# Patient Record
Sex: Female | Born: 1965 | Race: White | Hispanic: No | Marital: Married | Smoking: Never smoker
Health system: Southern US, Community
[De-identification: ages and names within clinical notes are randomized; demographics above are authoritative.]

## PROBLEM LIST (undated history)

## (undated) DIAGNOSIS — K859 Acute pancreatitis without necrosis or infection, unspecified: Secondary | ICD-10-CM

## (undated) DIAGNOSIS — K219 Gastro-esophageal reflux disease without esophagitis: Secondary | ICD-10-CM

## (undated) DIAGNOSIS — IMO0002 Reserved for concepts with insufficient information to code with codable children: Secondary | ICD-10-CM

## (undated) DIAGNOSIS — J40 Bronchitis, not specified as acute or chronic: Secondary | ICD-10-CM

## (undated) DIAGNOSIS — Z803 Family history of malignant neoplasm of breast: Secondary | ICD-10-CM

## (undated) DIAGNOSIS — G4730 Sleep apnea, unspecified: Secondary | ICD-10-CM

## (undated) DIAGNOSIS — E785 Hyperlipidemia, unspecified: Secondary | ICD-10-CM

## (undated) HISTORY — PX: BUNIONECTOMY: SHX129

## (undated) HISTORY — DX: Sleep apnea, unspecified: G47.30

## (undated) HISTORY — DX: Gastro-esophageal reflux disease without esophagitis: K21.9

## (undated) HISTORY — DX: Acute pancreatitis without necrosis or infection, unspecified: K85.90

## (undated) HISTORY — DX: Hyperlipidemia, unspecified: E78.5

## (undated) HISTORY — DX: Reserved for concepts with insufficient information to code with codable children: IMO0002

## (undated) HISTORY — DX: Family history of malignant neoplasm of breast: Z80.3

## (undated) HISTORY — DX: Bronchitis, not specified as acute or chronic: J40

---
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
## 1999-08-22 ENCOUNTER — Other Ambulatory Visit: Admission: RE | Admit: 1999-08-22 | Discharge: 1999-08-22 | Payer: Self-pay | Admitting: Gynecology

## 2001-11-06 ENCOUNTER — Ambulatory Visit (HOSPITAL_COMMUNITY): Admission: RE | Admit: 2001-11-06 | Discharge: 2001-11-06 | Payer: Self-pay | Admitting: Obstetrics and Gynecology

## 2002-01-29 ENCOUNTER — Ambulatory Visit (HOSPITAL_COMMUNITY): Admission: RE | Admit: 2002-01-29 | Discharge: 2002-01-29 | Payer: Self-pay | Admitting: Obstetrics and Gynecology

## 2002-02-25 ENCOUNTER — Encounter: Admission: RE | Admit: 2002-02-25 | Discharge: 2002-02-25 | Payer: Self-pay | Admitting: Obstetrics and Gynecology

## 2002-04-09 ENCOUNTER — Inpatient Hospital Stay (HOSPITAL_COMMUNITY): Admission: RE | Admit: 2002-04-09 | Discharge: 2002-04-12 | Payer: Self-pay | Admitting: Obstetrics and Gynecology

## 2002-04-16 ENCOUNTER — Encounter: Admission: RE | Admit: 2002-04-16 | Discharge: 2002-05-16 | Payer: Self-pay | Admitting: Obstetrics and Gynecology

## 2002-05-26 ENCOUNTER — Other Ambulatory Visit: Admission: RE | Admit: 2002-05-26 | Discharge: 2002-05-26 | Payer: Self-pay | Admitting: Obstetrics and Gynecology

## 2002-09-25 ENCOUNTER — Encounter: Admission: RE | Admit: 2002-09-25 | Discharge: 2002-09-25 | Payer: Self-pay

## 2003-05-22 ENCOUNTER — Other Ambulatory Visit: Admission: RE | Admit: 2003-05-22 | Discharge: 2003-05-22 | Payer: Self-pay | Admitting: Obstetrics and Gynecology

## 2003-08-21 ENCOUNTER — Encounter: Admission: RE | Admit: 2003-08-21 | Discharge: 2003-11-19 | Payer: Self-pay | Admitting: Cardiology

## 2004-06-10 ENCOUNTER — Other Ambulatory Visit: Admission: RE | Admit: 2004-06-10 | Discharge: 2004-06-10 | Payer: Self-pay | Admitting: Obstetrics and Gynecology

## 2005-06-20 ENCOUNTER — Other Ambulatory Visit: Admission: RE | Admit: 2005-06-20 | Discharge: 2005-06-20 | Payer: Self-pay | Admitting: Obstetrics and Gynecology

## 2005-10-11 ENCOUNTER — Encounter: Admission: RE | Admit: 2005-10-11 | Discharge: 2005-10-11 | Payer: Self-pay | Admitting: Physician Assistant

## 2008-11-05 ENCOUNTER — Inpatient Hospital Stay (HOSPITAL_COMMUNITY): Admission: EM | Admit: 2008-11-05 | Discharge: 2008-11-13 | Payer: Self-pay | Admitting: Emergency Medicine

## 2009-12-06 ENCOUNTER — Emergency Department (HOSPITAL_COMMUNITY): Admission: EM | Admit: 2009-12-06 | Discharge: 2009-12-06 | Payer: Self-pay | Admitting: Emergency Medicine

## 2010-03-29 ENCOUNTER — Inpatient Hospital Stay (HOSPITAL_COMMUNITY): Admission: EM | Admit: 2010-03-29 | Discharge: 2010-04-02 | Payer: Self-pay | Admitting: Internal Medicine

## 2010-08-17 ENCOUNTER — Encounter (INDEPENDENT_AMBULATORY_CARE_PROVIDER_SITE_OTHER): Payer: Self-pay | Admitting: *Deleted

## 2010-08-24 NOTE — Letter (Signed)
Summary: New Patient letter  [facility]  [address] [place], [place] [address]   Phone: [phone]  Fax: [phone]       08/17/2010 MRN: [id number]  [name] [address] [place], [place]  [address]  Dear Ms. Shepler,  Welcome to the Gastroenterology Division at [facility].    You are scheduled to see Dr.  Arlyce Dice on 09-30-10 at 9:45A.M. on the 3rd floor at [facility], [address]  We ask that you try to arrive at our office 15 minutes prior to your appointment time to allow for check-in.  We would like you to complete the enclosed self-administered evaluation form prior to your visit and bring it with you on the day of your appointment.  We will review it with you.  Also, please bring a complete list of all your medications or, if you prefer, bring the medication bottles and we will list them.  Please bring your insurance card so that we may make a copy of it.  If your insurance requires a referral to see a specialist, please bring your referral form from your primary care physician.  Co-payments are due at the time of your visit and may be paid by cash, check or credit card.     Your office visit will consist of a consult with your physician (includes a physical exam), any laboratory testing he/she may order, scheduling of any necessary diagnostic testing (e.g. x-ray, ultrasound, CT-scan), and scheduling of a procedure (e.g. Endoscopy, Colonoscopy) if required.  Please allow enough time on your schedule to allow for any/all of these possibilities.    If you cannot keep your appointment, please call [phone] to cancel or reschedule prior to your appointment date.  This allows [place] the opportunity to schedule an appointment for another patient in need of care.  If you do not cancel or reschedule by 5 p.m. the business day prior to your appointment date, you will be charged a $50.00 late cancellation/no-show fee.    Thank you for choosing  [facility] for your medical needs.  We appreciate the opportunity to care for you.  Please visit [place] at our website  to learn more about our practice.                     Sincerely,                                                             The Gastroenterology Division

## 2010-09-15 LAB — COMPREHENSIVE METABOLIC PANEL
ALT: 12 U/L (ref 0–35)
ALT: 35 U/L (ref 0–35)
ALT: 37 U/L — ABNORMAL HIGH (ref 0–35)
AST: 36 U/L (ref 0–37)
AST: 46 U/L — ABNORMAL HIGH (ref 0–37)
Albumin: 3.1 g/dL — ABNORMAL LOW (ref 3.5–5.2)
Alkaline Phosphatase: 86 U/L (ref 39–117)
Alkaline Phosphatase: 70 U/L (ref 39–117)
Alkaline Phosphatase: 71 U/L (ref 39–117)
Alkaline Phosphatase: 80 U/L (ref 39–117)
BUN: 6 mg/dL (ref 6–23)
BUN: 6 mg/dL (ref 6–23)
BUN: 7 mg/dL (ref 6–23)
CO2: 23 mEq/L (ref 19–32)
CO2: 23 mEq/L (ref 19–32)
CO2: 24 mEq/L (ref 19–32)
CO2: 25 mEq/L (ref 19–32)
Calcium: 8.7 mg/dL (ref 8.4–10.5)
Calcium: 8.4 mg/dL (ref 8.4–10.5)
Calcium: 8.7 mg/dL (ref 8.4–10.5)
Chloride: 102 mEq/L (ref 96–112)
Chloride: 104 mEq/L (ref 96–112)
Creatinine, Ser: 0.68 mg/dL (ref 0.4–1.2)
GFR calc Af Amer: >60 mL/min (ref >60)
GFR calc non Af Amer: >60 mL/min (ref >60)
GFR calc non Af Amer: >60 mL/min (ref >60)
GFR calc non Af Amer: >60 mL/min (ref >60)
Glucose, Bld: 140 mg/dL — ABNORMAL HIGH (ref 70–99)
Glucose, Bld: 104 mg/dL — ABNORMAL HIGH (ref 70–99)
Glucose, Bld: 119 mg/dL — ABNORMAL HIGH (ref 70–99)
Potassium: 3.6 mEq/L (ref 3.5–5.1)
Potassium: 3.7 mEq/L (ref 3.5–5.1)
Potassium: 3.8 mEq/L (ref 3.5–5.1)
Sodium: 133 mEq/L — ABNORMAL LOW (ref 135–145)
Sodium: 134 mEq/L — ABNORMAL LOW (ref 135–145)
Sodium: 137 mEq/L (ref 135–145)
Total Bilirubin: 0.7 mg/dL (ref 0.3–1.2)
Total Bilirubin: 0.8 mg/dL (ref 0.3–1.2)
Total Bilirubin: 0.8 mg/dL (ref 0.3–1.2)
Total Protein: 6.5 g/dL (ref 6.0–8.3)
Total Protein: 5.9 g/dL — ABNORMAL LOW (ref 6.0–8.3)
Total Protein: 6.2 g/dL (ref 6.0–8.3)

## 2010-09-15 LAB — CBC
HCT: 34.6 % — ABNORMAL LOW (ref 36.0–46.0)
HCT: 34 % — ABNORMAL LOW (ref 36.0–46.0)
HCT: 34.5 % — ABNORMAL LOW (ref 36.0–46.0)
HCT: 36.8 % (ref 36.0–46.0)
Hemoglobin: 10.8 g/dL — ABNORMAL LOW (ref 12.0–15.0)
Hemoglobin: 11.1 g/dL — ABNORMAL LOW (ref 12.0–15.0)
Hemoglobin: 12.1 g/dL (ref 12.0–15.0)
Hemoglobin: 12.4 g/dL (ref 12.0–15.0)
MCH: 29.7 pg (ref 26.0–34.0)
MCHC: 33.5 g/dL (ref 30.0–36.0)
MCHC: 31.8 g/dL (ref 30.0–36.0)
MCHC: 32.2 g/dL (ref 30.0–36.0)
MCHC: 33.5 g/dL (ref 30.0–36.0)
MCV: 90.6 fL (ref 78.0–100.0)
MCV: 90.1 fL (ref 78.0–100.0)
MCV: 90.2 fL (ref 78.0–100.0)
Platelets: 277 10*3/uL (ref 150–400)
RBC: 3.73 MIL/uL — ABNORMAL LOW (ref 3.87–5.11)
RBC: 4.08 MIL/uL (ref 3.87–5.11)
RDW: 13.5 % (ref 11.5–15.5)
RDW: 13.8 % (ref 11.5–15.5)
RDW: 14.2 % (ref 11.5–15.5)
WBC: 9.7 10*3/uL (ref 4.0–10.5)

## 2010-09-15 LAB — GLUCOSE, CAPILLARY
Glucose-Capillary: 148 mg/dL — ABNORMAL HIGH (ref 70–99)
Glucose-Capillary: 178 mg/dL — ABNORMAL HIGH (ref 70–99)
Glucose-Capillary: 117 mg/dL — ABNORMAL HIGH (ref 70–99)
Glucose-Capillary: 120 mg/dL — ABNORMAL HIGH (ref 70–99)
Glucose-Capillary: 163 mg/dL — ABNORMAL HIGH (ref 70–99)
Glucose-Capillary: 175 mg/dL — ABNORMAL HIGH (ref 70–99)

## 2010-09-15 LAB — LIPASE, BLOOD: Lipase: 20 U/L (ref 11–59)

## 2010-09-15 LAB — AMYLASE
Amylase: 33 U/L (ref 0–105)
Amylase: 55 U/L (ref 0–105)

## 2010-09-15 LAB — LIPID PANEL
Total CHOL/HDL Ratio: 7.2 RATIO
Triglycerides: 1305 mg/dL — ABNORMAL HIGH (ref <150)
VLDL: UNDETERMINED mg/dL (ref 0–40)

## 2010-09-19 LAB — COMPREHENSIVE METABOLIC PANEL
ALT: 18 U/L (ref 0–35)
AST: 26 U/L (ref 0–37)
CO2: 21 mEq/L (ref 19–32)
Calcium: 9.6 mg/dL (ref 8.4–10.5)
Chloride: 104 mEq/L (ref 96–112)
Creatinine, Ser: 0.67 mg/dL (ref 0.4–1.2)
GFR calc non Af Amer: >60 mL/min (ref >60)
Glucose, Bld: 122 mg/dL — ABNORMAL HIGH (ref 70–99)
Total Bilirubin: 0.5 mg/dL (ref 0.3–1.2)

## 2010-09-19 LAB — LIPASE, BLOOD: Lipase: 22 U/L (ref 11–59)

## 2010-09-19 LAB — CBC
HCT: 37.2 % (ref 36.0–46.0)
Hemoglobin: 13.2 g/dL (ref 12.0–15.0)
MCHC: 35.5 g/dL (ref 30.0–36.0)
MCV: 96.8 fL (ref 78.0–100.0)
RBC: 3.84 MIL/uL — ABNORMAL LOW (ref 3.87–5.11)
WBC: 8 10*3/uL (ref 4.0–10.5)

## 2010-09-19 LAB — DIFFERENTIAL
Basophils Absolute: 0 10*3/uL (ref 0.0–0.1)
Eosinophils Relative: 3 % (ref 0–5)
Lymphs Abs: 2.6 10*3/uL (ref 0.7–4.0)
Monocytes Absolute: 0.5 10*3/uL (ref 0.1–1.0)
Monocytes Relative: 6 % (ref 3–12)
Neutrophils Relative %: 59 % (ref 43–77)

## 2010-09-19 LAB — POCT CARDIAC MARKERS: CKMB, poc: 1.9 ng/mL (ref 1.0–8.0)

## 2010-09-30 ENCOUNTER — Encounter: Payer: Self-pay | Admitting: Gastroenterology

## 2010-09-30 ENCOUNTER — Ambulatory Visit (INDEPENDENT_AMBULATORY_CARE_PROVIDER_SITE_OTHER): Payer: BC Managed Care – PPO | Admitting: Gastroenterology

## 2010-09-30 DIAGNOSIS — R1032 Left lower quadrant pain: Secondary | ICD-10-CM | POA: Insufficient documentation

## 2010-09-30 DIAGNOSIS — Z1211 Encounter for screening for malignant neoplasm of colon: Secondary | ICD-10-CM

## 2010-09-30 DIAGNOSIS — R109 Unspecified abdominal pain: Secondary | ICD-10-CM

## 2010-09-30 DIAGNOSIS — K859 Acute pancreatitis without necrosis or infection, unspecified: Secondary | ICD-10-CM

## 2010-09-30 HISTORY — DX: Acute pancreatitis without necrosis or infection, unspecified: K85.90

## 2010-09-30 MED ORDER — PEG-KCL-NACL-NASULF-NA ASC-C 100 G PO SOLR: 1.0000 | Freq: Once | ORAL | Status: AC

## 2010-09-30 NOTE — Assessment & Plan Note (Addendum)
Etiology for her pancreatitis has not been determined. Although gallbladder stones were not seen by CT small stones or sludge are possibilities.  Recommendations #1 abdominal ultrasound

## 2010-09-30 NOTE — Assessment & Plan Note (Signed)
Patient's symptoms are very nonspecific and mild.  Recommendations #1 no treatment at this point. #2 screening colonoscopy

## 2010-09-30 NOTE — Patient Instructions (Signed)
Colonoscopy A colonoscopy is an exam to evaluate your entire colon. In this exam, your colon is cleansed. A long fiberoptic tube is inserted through your rectum and into your colon. The fiberoptic scope (endoscope) is a long bundle of enclosed and very flexible fibers. These fibers transmit light to the area examined and send images from that area to your caregiver. Discomfort is usually minimal. You may be given a drug to help you sleep (sedative) during or prior to the procedure. This exam helps to detect lumps (tumors), polyps, inflammation, and areas of bleeding. Your caregiver may also take a small piece of tissue (biopsy) that will be examined under a microscope. BEFORE THE PROCEDURE  A clear liquid diet may be required for 2 days before the exam.   Liquid injections (enemas) or laxatives may be required.   A large amount of electrolyte solution may be given to you to drink over a short period of time. This solution is used to clean out your colon.   You should be present 1  prior to your procedure or as directed by your caregiver.   Check in at the admissions desk to fill out necessary forms if not preregistered. There will be consent forms to sign prior to the procedure. If accompanied by friends or family, there is a waiting area for them while you are having your procedure.  LET YOUR CAREGIVER KNOW ABOUT:  Allergies to food or medicine.  Medicines taken, including vitamins, herbs, eyedrops, over-the-counter medicines, and creams.   Use of steroids (by mouth or creams).   Previous problems with anesthetics or numbing medicines.   History of bleeding problems or blood clots.  Previous surgery.   Other health problems, including diabetes and kidney problems.   Possibility of pregnancy, if this applies.   AFTER THE PROCEDURE  If you received a sedative and/or pain medicine, you will need to arrange for someone to drive you home.   Occasionally, there is a little blood passed  with the first bowel movement. DO NOT be concerned.  HOME CARE INSTRUCTIONS  It is not unusual to pass moderate amounts of gas and experience mild abdominal cramping following the procedure. This is due to air being used to inflate your colon during the exam. Walking or a warm pack on your belly (abdomen) may help.   You may resume all normal meals and activities after sedatives and medicines have worn off.   Only take over-the-counter or prescription medicines for pain, discomfort, or fever as directed by your caregiver. DO NOT use aspirin or blood thinners if a biopsy was taken. Consult your caregiver for medicine usage if biopsies were taken.  FINDING OUT THE RESULTS OF YOUR TEST Not all test results are available during your visit. If your test results are not back during the visit, make an appointment with your caregiver to find out the results. Do not assume everything is normal if you have not heard from your caregiver or the medical facility. It is important for you to follow up on all of your test results. SEEK IMMEDIATE MEDICAL CARE IF:  You pass large blood clots or fill a toilet with blood following the procedure. This may also occur 10 to 14 days following the procedure. This is more likely if a biopsy was taken.   You develop abdominal pain that keeps getting worse and cannot be relieved with medicine.  Document Released: 06/16/2000 Document Re-Released: 09/13/2009 [facility] Patient Information 2011 [place], [place]. Your Abdominal U/S is scheduled  On 10/05/2010 at am You will have nothing to eat or drink after midnight  Your Colonoscopy with Propofol is scheduled on 10/13/2010 at 2pm

## 2010-09-30 NOTE — Progress Notes (Signed)
History of Present Illness:  [name] is a pleasant, 45 year old white female here for evaluation of abdominal pain. She's had mild left lower quadrant pain on and off for a couple of weeks. There has been no change in bowel habits. She denies rectal bleeding or melena. Family history pertinent for her paternal grandfather who had colon cancer. Father had colon polyps.  The patient has a history of recurrent pancreatitis. One episode was attributed to steroids that she took for poison ivy. The second episode occurred spontaneously. She was hospitalized. CT scan in September, 2011 was unremarkable except for inflammatory changes around the pancreas. She does not drink.  The patient has diabetes and sleep apnea.    Review of Systems: Pertinent positive and negative review of systems were noted in the above HPI section. All other review of systems were otherwise negative.    Current Medications, Allergies, Past Medical History, Past Surgical History, Family History and Social History were reviewed in [employer] electronic medical record  Vital signs were reviewed in today's medical record. Physical Exam: General: Well developed , well nourished, no acute distress Head: Normocephalic and atraumatic Eyes:  sclerae anicteric, EOMI Ears: Normal auditory acuity Mouth: No deformity or lesions Lungs: Clear throughout to auscultation Heart: Regular rate and rhythm; no murmurs, rubs or bruits Abdomen: Soft, non tender and non distended. No masses, hepatosplenomegaly or hernias noted. Normal Bowel sounds Rectal:deferred Musculoskeletal: Symmetrical with no gross deformities  Pulses:  Normal pulses noted Extremities: No clubbing, cyanosis, edema or deformities noted Neurological: Alert oriented x 4, grossly nonfocal Psychological:  Alert and cooperative. Normal mood and affect

## 2010-09-30 NOTE — Assessment & Plan Note (Signed)
Plan screening colonoscopy 

## 2010-10-05 ENCOUNTER — Ambulatory Visit (HOSPITAL_COMMUNITY)
Admission: RE | Admit: 2010-10-05 | Discharge: 2010-10-05 | Disposition: A | Discharge status: Home / Self Care | Payer: BC Managed Care – PPO | Source: Ambulatory Visit | Attending: Gastroenterology | Admitting: Gastroenterology

## 2010-10-05 DIAGNOSIS — R109 Unspecified abdominal pain: Secondary | ICD-10-CM | POA: Insufficient documentation

## 2010-10-11 LAB — LIPASE, BLOOD
Lipase: 198 U/L — ABNORMAL HIGH (ref 11–59)
Lipase: 68 U/L — ABNORMAL HIGH (ref 11–59)

## 2010-10-11 LAB — CBC
HCT: 37.2 % (ref 36.0–46.0)
HCT: 38.6 % (ref 36.0–46.0)
HCT: 38.9 % (ref 36.0–46.0)
Hemoglobin: 11.5 g/dL — ABNORMAL LOW (ref 12.0–15.0)
Hemoglobin: 13 g/dL (ref 12.0–15.0)
MCHC: 34.6 g/dL (ref 30.0–36.0)
MCHC: 35 g/dL (ref 30.0–36.0)
MCV: 89.7 fL (ref 78.0–100.0)
MCV: 89.8 fL (ref 78.0–100.0)
MCV: 92.6 fL (ref 78.0–100.0)
Platelets: 210 10*3/uL (ref 150–400)
Platelets: 219 10*3/uL (ref 150–400)
Platelets: 249 10*3/uL (ref 150–400)
RBC: 3.7 MIL/uL — ABNORMAL LOW (ref 3.87–5.11)
RBC: 4.37 MIL/uL (ref 3.87–5.11)
RDW: 14.1 % (ref 11.5–15.5)
RDW: 14.5 % (ref 11.5–15.5)
WBC: 12 10*3/uL — ABNORMAL HIGH (ref 4.0–10.5)
WBC: 8.4 10*3/uL (ref 4.0–10.5)

## 2010-10-11 LAB — GLUCOSE, CAPILLARY
Glucose-Capillary: 167 mg/dL — ABNORMAL HIGH (ref 70–99)
Glucose-Capillary: 175 mg/dL — ABNORMAL HIGH (ref 70–99)
Glucose-Capillary: 177 mg/dL — ABNORMAL HIGH (ref 70–99)
Glucose-Capillary: 181 mg/dL — ABNORMAL HIGH (ref 70–99)
Glucose-Capillary: 182 mg/dL — ABNORMAL HIGH (ref 70–99)
Glucose-Capillary: 188 mg/dL — ABNORMAL HIGH (ref 70–99)
Glucose-Capillary: 191 mg/dL — ABNORMAL HIGH (ref 70–99)
Glucose-Capillary: 192 mg/dL — ABNORMAL HIGH (ref 70–99)
Glucose-Capillary: 194 mg/dL — ABNORMAL HIGH (ref 70–99)
Glucose-Capillary: 211 mg/dL — ABNORMAL HIGH (ref 70–99)
Glucose-Capillary: 216 mg/dL — ABNORMAL HIGH (ref 70–99)
Glucose-Capillary: 217 mg/dL — ABNORMAL HIGH (ref 70–99)
Glucose-Capillary: 222 mg/dL — ABNORMAL HIGH (ref 70–99)
Glucose-Capillary: 236 mg/dL — ABNORMAL HIGH (ref 70–99)
Glucose-Capillary: 238 mg/dL — ABNORMAL HIGH (ref 70–99)
Glucose-Capillary: 240 mg/dL — ABNORMAL HIGH (ref 70–99)
Glucose-Capillary: 249 mg/dL — ABNORMAL HIGH (ref 70–99)
Glucose-Capillary: 254 mg/dL — ABNORMAL HIGH (ref 70–99)
Glucose-Capillary: 263 mg/dL — ABNORMAL HIGH (ref 70–99)
Glucose-Capillary: 266 mg/dL — ABNORMAL HIGH (ref 70–99)
Glucose-Capillary: 269 mg/dL — ABNORMAL HIGH (ref 70–99)
Glucose-Capillary: 273 mg/dL — ABNORMAL HIGH (ref 70–99)
Glucose-Capillary: 280 mg/dL — ABNORMAL HIGH (ref 70–99)
Glucose-Capillary: 307 mg/dL — ABNORMAL HIGH (ref 70–99)

## 2010-10-11 LAB — BASIC METABOLIC PANEL
BUN: 4 mg/dL — ABNORMAL LOW (ref 6–23)
BUN: 5 mg/dL — ABNORMAL LOW (ref 6–23)
BUN: <1 mg/dL — ABNORMAL LOW (ref 6–23)
CO2: 21 mEq/L (ref 19–32)
CO2: 24 mEq/L (ref 19–32)
Chloride: 101 mEq/L (ref 96–112)
Chloride: 103 mEq/L (ref 96–112)
Creatinine, Ser: 0.74 mg/dL (ref 0.4–1.2)
GFR calc Af Amer: >60 mL/min (ref >60)
GFR calc non Af Amer: >60 mL/min (ref >60)
GFR calc non Af Amer: >60 mL/min (ref >60)
Glucose, Bld: 209 mg/dL — ABNORMAL HIGH (ref 70–99)
Glucose, Bld: 95 mg/dL (ref 70–99)
Potassium: 3.4 mEq/L — ABNORMAL LOW (ref 3.5–5.1)
Potassium: 3.8 mEq/L (ref 3.5–5.1)
Potassium: 4 mEq/L (ref 3.5–5.1)
Sodium: 136 mEq/L (ref 135–145)

## 2010-10-11 LAB — LIPID PANEL
Cholesterol: 504 mg/dL — ABNORMAL HIGH (ref 0–200)
Total CHOL/HDL Ratio: 18.7 RATIO
Triglycerides: 8355 mg/dL — ABNORMAL HIGH (ref <150)
VLDL: UNDETERMINED mg/dL (ref 0–40)

## 2010-10-11 LAB — COMPREHENSIVE METABOLIC PANEL
ALT: 32 U/L (ref 0–35)
AST: 37 U/L (ref 0–37)
Albumin: 2.6 g/dL — ABNORMAL LOW (ref 3.5–5.2)
Albumin: 3.3 g/dL — ABNORMAL LOW (ref 3.5–5.2)
Alkaline Phosphatase: 87 U/L (ref 39–117)
BUN: 5 mg/dL — ABNORMAL LOW (ref 6–23)
BUN: 9 mg/dL (ref 6–23)
CO2: 20 mEq/L (ref 19–32)
CO2: 25 mEq/L (ref 19–32)
Calcium: 8.4 mg/dL (ref 8.4–10.5)
Chloride: 101 mEq/L (ref 96–112)
Chloride: 102 mEq/L (ref 96–112)
Creatinine, Ser: 0.56 mg/dL (ref 0.4–1.2)
GFR calc Af Amer: >60 mL/min (ref >60)
GFR calc non Af Amer: >60 mL/min (ref >60)
GFR calc non Af Amer: >60 mL/min (ref >60)
Glucose, Bld: 243 mg/dL — ABNORMAL HIGH (ref 70–99)
Glucose, Bld: 247 mg/dL — ABNORMAL HIGH (ref 70–99)
Potassium: 6.1 mEq/L — ABNORMAL HIGH (ref 3.5–5.1)
Sodium: 134 mEq/L — ABNORMAL LOW (ref 135–145)
Total Bilirubin: 0.8 mg/dL (ref 0.3–1.2)
Total Bilirubin: 0.9 mg/dL (ref 0.3–1.2)
Total Bilirubin: 2.8 mg/dL — ABNORMAL HIGH (ref 0.3–1.2)
Total Protein: 6 g/dL (ref 6.0–8.3)

## 2010-10-11 LAB — POCT CARDIAC MARKERS: Myoglobin, poc: 95.2 ng/mL (ref 12–200)

## 2010-10-11 LAB — DIFFERENTIAL
Basophils Absolute: 0 10*3/uL (ref 0.0–0.1)
Basophils Relative: 0 % (ref 0–1)
Eosinophils Relative: 1 % (ref 0–5)
Monocytes Absolute: 0.3 10*3/uL (ref 0.1–1.0)
Neutro Abs: 10.4 10*3/uL — ABNORMAL HIGH (ref 1.7–7.7)

## 2010-10-11 LAB — CALCIUM: Calcium: 8.1 mg/dL — ABNORMAL LOW (ref 8.4–10.5)

## 2010-10-11 LAB — POCT PREGNANCY, URINE: Preg Test, Ur: NEGATIVE

## 2010-10-11 LAB — URINALYSIS, ROUTINE W REFLEX MICROSCOPIC
Glucose, UA: >1000 mg/dL — AB
Specific Gravity, Urine: 1.038 — ABNORMAL HIGH (ref 1.005–1.030)
Urobilinogen, UA: 0.2 mg/dL (ref 0.0–1.0)

## 2010-10-11 LAB — BILIRUBIN, FRACTIONATED(TOT/DIR/INDIR)
Bilirubin, Direct: 0.2 mg/dL (ref 0.0–0.3)
Indirect Bilirubin: 0.3 mg/dL (ref 0.3–0.9)

## 2010-10-11 LAB — HEMOGLOBIN A1C: Mean Plasma Glucose: 163 mg/dL

## 2010-10-13 ENCOUNTER — Encounter: Payer: BC Managed Care – PPO | Admitting: Gastroenterology

## 2010-10-17 ENCOUNTER — Telehealth: Payer: Self-pay | Admitting: Gastroenterology

## 2010-10-17 NOTE — Telephone Encounter (Signed)
Please advise on US results.

## 2010-10-18 NOTE — Telephone Encounter (Signed)
Notified pt her U/S was normal. She will have to go elsewhere for her COLON and was upset to learn she can't come here anymore for office visits. Pt requested I mail her U/S to her.- done.

## 2010-10-18 NOTE — Telephone Encounter (Signed)
Normal ultrasound

## 2010-10-18 NOTE — Telephone Encounter (Signed)
Lmom for pt to call back. Per Dr Arlyce Dice her U/S was normal.

## 2010-11-15 NOTE — Discharge Summary (Signed)
[name], [name]                ACCOUNT NO.:  [id number]   MEDICAL RECORD NO.:  [id number]          PATIENT TYPE:  INP   LOCATION:  5151                         FACILITY:  [facility]   PHYSICIAN:  Charlestine Massed, MDDATE OF BIRTH:  12/18/1965   DATE OF ADMISSION:  11/05/2008  DATE OF DISCHARGE:  11/10/2008                               DISCHARGE SUMMARY   PRIMARY CARE PHYSICIAN:  The patient is expected to follow up with Dr.  Della Goo.   REASON FOR ADMISSION:  Right upper quadrant and epigastric pain.   DISCHARGE DIAGNOSES:  1. Acute pancreatitis secondary to hypertriglyceridemia.  2. 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  3. Dyslipidemia with a low HDL and metabolic syndrome.  4. Obesity.  5. Obstructive sleep apnea, currently not using a continuous positive      airway pressure at home.  6. Newly diagnosed diabetes mellitus.   DISCHARGE MEDICATIONS:  The full set of medications will be dictated at  the time of actual discharge.   HOSPITAL COURSE:  1. Acute pancreatitis.  [name] is a 45 year old female with      past history of gestational diabetes and obesity who came to the      emergency room with complaints of right upper quadrant and      epigastric pain.  She was diagnosed with pancreatitis in the      emergency room, due to the elevated lipase levels was put on n.p.o.      Ultrasonographic examination revealed absence of any gallstone.      She was put n.p.o., put on IV fluids.  Blood sugars were found to      be high and was diagnosed with diabetes because her hemoglobin A1c      was 7.3 and blood sugars are more than 300 at the time of      admission.   CAT scan of the abdomen was done on day 3 of pancreatitis.  Lipid  profile revealed that the triglyceride level is 1594, so the patient has  been diagnosed with acute pancreatitis secondary to  hypertriglyceridemia.  CAT scan revealed peripancreatic fluid and edema  of the  pancreas, with the peripancreatic fluid seeping all the way to  the right lower quadrant and hypogastric area, which is possibly causing  the tenderness and rebound tenderness in the right lower quadrant.   The patient was started on clear liquids yesterday.  Today, the patient  has been advanced to bland soft diet in the morning and advanced to  mechanical soft diet at p.m.  If the patient is stable, then the patient  can be discharged in the next 2 days back home.  1. Hypertriglyceridemia.  She has had triglyceride level of 1594.  She      has a family history of high-cholesterol level, started on Lovaza,      gemfibrozil, and niacin.  The patient is tolerating niacin very      well, so she will be continued on these medications.  The patient      will be started on a statin at the  time of discharge.  2. Newly diagnosed diabetes, started on Lantus sliding scale.      Currently, she is stable.  Blood sugars are stable.  She has been      advised a diabetic diet.  Home health for diabetes will be set up      at the time of discharge.  She has been educated about diabetes and      diabetes teaching was done.  She has been educated about how to      inject the insulin and to test for blood sugars.  3. Obstructive sleep apnea history.  The patient stated she was      diagnosed with obstructive sleep apnea.  She is not using the CPAP      machine.  The patient has been educated about using it and needs to      be set up by the time of discharge.  4. Obesity.  The patient has been advised about the effects of diet      and exercise on hypertriglyceridemia and metabolic syndrome.  She      has been educated in detail about its effects on heart disease,      stroke, kidney disease, and the presence of metabolic syndrome.      She exhibited understanding.   DISPOSITION:  The patient will be discharged back home at the time of  discharge.  For infections:  1. Do blood sugar tests regularly.   2. Followup with primary care doctor regularly for further checks and      for followup for diabetes.   Any addendum to the discharge summary and the list of medications will  be dictated at the time of discharge.      Charlestine Massed, MD  Electronically Signed     UT/MEDQ  D:  11/10/2008  T:  11/11/2008  Job:  [id number]   cc:   Della Goo, M.D.

## 2010-11-15 NOTE — Discharge Summary (Signed)
[name], [name]                ACCOUNT NO.:  [id number]   MEDICAL RECORD NO.:  [id number]          PATIENT TYPE:  INP   LOCATION:  5159                         FACILITY:  [facility]   PHYSICIAN:  Michelene Gardener, MD    DATE OF BIRTH:  [birth year]   DATE OF ADMISSION:  11/05/2008  DATE OF DISCHARGE:  11/13/2008                               DISCHARGE SUMMARY   DISCHARGE DIAGNOSES:  1. Acute pancreatitis secondary to hypertriglyceridemia.  2. Hypertriglyceridemia.  3. Dyslipidemia with low HDL and metabolic syndrome.  4. Obesity.  5. Obstructive sleep apnea, and the patient is supposed to be on CPAP      at home but she has been noncompliant.  6. Newly diagnosed diabetes mellitus.   DISCHARGE MEDICATIONS:  1. Gemfibrozil 600 mg p.o. twice daily.  2. Metformin 500 mg twice daily.  3. Simvastatin 40 mg at bedtime.  4. Vicodin 1-2 tablets p.o. every 4 hours as needed.  5. Phenergan 25 mg every 4 hours as needed.  6. Omega-3 fatty acid 3 g once a day.   CONSULTATIONS:  None.   PROCEDURES:  None.   DIAGNOSTIC STUDIES:  1. Abdominal ultrasound on Nov 05, 2008, showed no acute abnormality.  2. CT scan of the abdomen with contrast on Nov 07, 2008, showed acute      pancreatitis without complicating features and mild diffuse fatty      infiltration of the liver.  3. CT scan of the pelvis with contrast on Nov 07, 2008, showed small      amount of fluid from the pancreatitis extending into the pelvis.   COURSE OF HOSPITALIZATION:  For detailed course of hospitalization up to  Nov 10, 2008, please see previously dictated discharge summary by Dr.  Charlestine Massed.  Since that time, the patient did not have major  events.  She continued to improve.  Her diet was advanced.  Pain  medications have been titrated down where her Dilaudid was discontinued,  and she was switched to Vicodin.  At the time of discharge, the patient  is feeling very well.  She had minimal optimal pain.  There is  no  nausea.  She is tolerating her diet very well.  I will discharge her  home on the above-mentioned medications.  I advised her to follow up  with her primary doctor within a week  for further adjustment in her diabetic medications.  I also instructed  her to measure her fingersticks at least 2 or 3 times a day and take  those readings to her primary doctor.   Total assessment time is 40 minutes.      Michelene Gardener, MD  Electronically Signed     NAE/MEDQ  D:  11/13/2008  T:  11/14/2008  Job:  [id number]

## 2010-11-15 NOTE — H&P (Signed)
NAMESHAMIRACLE, [name]                ACCOUNT NO.:  [id number]   MEDICAL RECORD NO.:  [id number]          PATIENT TYPE:  INP   LOCATION:  5151                         FACILITY:  [facility]   PHYSICIAN:  Monte Fantasia, MD  DATE OF BIRTH:  01-25-1966   DATE OF ADMISSION:  11/05/2008  DATE OF DISCHARGE:                              HISTORY & PHYSICAL   CHIEF COMPLAINT:  Right and left upper quadrant pain.   HISTORY OF PRESENT ILLNESS:  This is a 45 year old white woman with past  medical history of gestational diabetes in 2003, obesity, hyperlipidemia  (with increased triglycerides with the last value of 367 a year ago  checked by her PCP, per her report), obstructive sleep apnea, presenting  with abdominal pain.  The abdominal pain started this morning upon  waking up, it is throbbing approximately 5/10 in intensity, starting in  the left upper quadrant, advancing to the midepigastric region and then  to the right upper quadrant.  She also had 1 episode of nausea and  vomiting this a.m., which subsided and she attempted to eat afterwards.  The pain became more intense with eating.  She has no associated  diarrhea, constipation, fever, chills, dysuria, or urinary frequency,  but feels dry and dizzy.  She has no history of alcohol and drug use and  she had a hemoglobin A1c checked a year ago and it was 5.0.   PAST MEDICAL HISTORY:  1. Gestational diabetes in 2003.  2. Hyperlipidemia with increased triglycerides.  3. Obesity.  4. Obstructive sleep apnea (not wearing her CPAP because it is not      functioning).   MEDICATIONS AT HOME:  Loestrin Fe 1/20 (oral contraceptive 1 tablet p.o.  daily).   SOCIAL HISTORY:  She lives in [place] with her husband and 2  children.  She never smoked.  She drinks 1-2 wine glasses or beers a  week.  No drugs.  She is a Futures trader.   ALLERGIES:  Erythromycin base causes her to have rapid heart rate.   FAMILY HISTORY:  Father increased triglycerides,  adult-onset diabetes  mellitus and mom is a breast cancer survivor.   REVIEW OF SYSTEMS:  Per HPI including positive for gaining weight.  No  dysuria, no urinary frequency, and no vaginal discharge.   PHYSICAL EXAMINATION:  VITAL SIGNS:  Temperature 97.9, pulse 96, blood  pressure 123/84, respiratory rate 16, oxygen saturation 95% on room air.  GENERAL:  She appears in distress because of nausea and dry heaving.  The pain level is acceptable.  HEENT:  PERRLA.  No arcus senilis.  CARDIAC:  Regular rate and rhythm.  No murmurs, rubs, or gallops.  PULMONARY:  Clear to auscultation bilaterally.  GASTROINTESTINAL:  Soft.  Bowel sound decreased.  Diffusely mild  tenderness to palpation in upper quadrant.  No guarding.  Murphy's sign  negative.  EXTREMITIES:  No edema.  NEUROLOGIC:  Nonfocal.  PSYCH:  Appropriate.   LABORATORY DATA:  Urine pregnancy test negative.  White count 12.0 with  89% PMN, hemoglobin 13.0, platelets 249, MCV 88.3.  Sodium 134,  potassium 6.1 (hemolyzed,  on repeat 3.0), chloride 102, bicarb 20, BUN  9, creatinine 0.69, glucose 247, bilirubin 2.0 (hemolyzed  blood), A  phos 87, AST 63, ALT 21, albumin 3.3, calcium 9.1.  Point of care  cardiac enzymes negative.  Lipase 366.  Urinalysis:  Specific gravity  1.038, glucose more than 1000, ketones 15, small blood, protein 30, few  bacteria.  Right upper quadrant ultrasound; no acute abnormality,  borderline enlargement bilateral kidneys, but normal echotexture.   ASSESSMENT/PLAN:  This is an obese white woman presenting with upper  quadrant abdominal pain.  1. Pancreatitis - lipase 366.  She does not have any history of      alcohol use, but she is on estrogen oral contraceptive, which can      have pancreatitis as a side effect.  She also notes that she has      hypercholesterolemia (triglycerides were 360 a year ago and she is      not on meds for it).  She does not have any cholecystic problems      despite having  right upper quadrant pain.  We will admit her to      regular floor, keep her n.p.o. for now.  Hydrate her vigorously off      her pain control (Dilaudid and morphine) depending on the level of      the pain and nausea control with Phenergan.  She received morphine,      Dilaudid, and Zofran in the ED.  The pain seems to be well      controlled on these medicines.  We will also check cholesterol in      a.m. and she will need to be on a statin when she starts taking      p.o.  Alternatively, if only the triglycerides are elevated, she      would probably benefit from gemfibrozil, fish oil, or niacin.  We      will also recheck the lipase in the morning.  The patient will need      to stop estrogen, since this is contraindicating in active hepatic      disease and because it can cause pancreatitis.  2. Increased AST.  This is likely fatty liver.  Will benefit from      statin.  3. Hyperglycemia.  The patient is possibly diabetic, especially with      her history of gestational diabetes, but she has a normal      hemoglobin A1c 5.0 a year ago.  We will continue to check CBGs q.4      h. while n.p.o. and then a.c. and h.s.  We are going to cover dose      with insulin boluses depending on the level of the glycemia.  We      will also check another hemoglobin A1c.  4. Dehydration.  We will hydrate the patient.  I do not see the point      of checking orthostatics for now, since she is clearly appears      dehydrated.  5. Hyperkalemia.  This was actually caused by hemolysis and on repeat,      her potassium was 3.0.  We will give potassium, since she is      vomiting too.  6. Prophylaxis.  We will give her Protonix and we will offer SCDs for      DVT prophylaxis.      Carlus Pavlov, M.D.  Electronically Signed      Monte Fantasia, MD  Electronically Signed    CG/MEDQ  D:  11/05/2008  T:  11/06/2008  Job:  [id number]

## 2010-11-15 NOTE — Discharge Summary (Signed)
[name], [name]                ACCOUNT NO.:  [id number]   MEDICAL RECORD NO.:  [id number]          PATIENT TYPE:  INP   LOCATION:  5151                         FACILITY:  [facility]   PHYSICIAN:  Charlestine Massed, MDDATE OF BIRTH:  [birth year]   DATE OF ADMISSION:  11/05/2008  DATE OF DISCHARGE:                               DISCHARGE SUMMARY   PRIMARY CARE PHYSICIAN:  The patient has been assigned to see Dr.  Della Goo   REASON FOR ADMISSION:  Right upper quadrant pain.   DISCHARGE DIAGNOSES:  1. Acute pancreatitis secondary to hypertriglyceridemia.  2. Hypertriglyceridemia with triglyceride level of 1594.  3. Dyslipidemia with metabolic syndrome.  4. Newly diagnosed diabetes mellitus.  5. Obesity.  6. Obstructive sleep apnea, not wearing CPAP because it is not      functioning.  Currently, CPAP available at home.   DISCHARGE MEDICATIONS:  The full set of medications will be dictated at  the time of discharge.   HOSPITAL COURSE BY PROBLEM:  Acute pancreatitis:  The patient was  admitted with acute abdominal pain.  She had intense pain with radiation  in the epigastrium, right upper quadrant and the right lower quadrant as  well as the periumbilical areas.  She was having nausea and vomiting,  and she was seen in the emergency room and was admitted with a diagnosis  of acute pancreatitis as she had a high lipase level.  On the next day,  the lipid profile came back saying that triglyceride level was 8000.  In  view of the report, we repeated the lipid level as a fasting which came  back as 1594.  Patient had a sonogram which was negative for gallstones.  She is not an alcoholic, so the diagnosis of acute pancreatitis was  secondary to hypertriglyceridemia was made.  She was started on  gemfibrozil, insulin and   Dictation ended at this point.      Charlestine Massed, MD  Electronically Signed     UT/MEDQ  D:  11/10/2008  T:  11/10/2008  Job:  [id number]   cc:    Della Goo, M.D.

## 2010-11-18 NOTE — Op Note (Signed)
NAMEMARGREE, [name]                          ACCOUNT NO.:  [id number]   MEDICAL RECORD NO.:  [id number]                   PATIENT TYPE:  INP   LOCATION:  9198                                 FACILITY:  [facility]   PHYSICIAN:  Guy Sandifer. Arleta Creek, M.D.           DATE OF BIRTH:  1966-03-04   DATE OF PROCEDURE:  04/09/2002  DATE OF DISCHARGE:                                 OPERATIVE REPORT   PREOPERATIVE DIAGNOSES:  1. Intrauterine pregnancy at 42 and 5/7 weeks estimated gestational age.  2. Gestational diabetes.  3. Breech.   POSTOPERATIVE DIAGNOSES:  1. Intrauterine pregnancy at 93 and 5/7 weeks estimated gestational age.  2. Gestational diabetes.  3. Breech.   PROCEDURE:  Low transverse cesarean section.   SURGEON:  Guy Sandifer. Henderson Cloud, M.D.   ASSISTANT:  Duke Salvia. Marcelle Overlie, M.D.   ANESTHESIA:  Spinal, Raul Del, M.D.   ESTIMATED BLOOD LOSS:  800 cc.   FINDINGS:  Viable female infant.  Apgars of 5 and 9 at one and five minutes,  respectively.  Arterial cord pH 7.31.  Birth weight 8 pounds 3 ounces.   INDICATIONS AND CONSENT:  This patient is a 45 year old married white female  G1, P0 with a breech presentation and gestational diabetes controlled with  diet.  Details are dictated in the history and physical.  Cesarean section  has been discussed with the patient.  Potential risks, complications have  been discussed including, but not limited to, infection, bladder or ureteral  damage, bleeding requiring transfusion of blood products with possible  transfusion reaction, HIV and hepatitis acquisition, DVT, PE, pneumonia.  All questions have been answered and consent is signed on the chart.   PROCEDURE:  The patient is taken to the operating room where spinal  anesthetic is placed.  She is then placed in the dorsal supine position with  a 15 degree left lateral wedge.  She is prepped abdominally.  Foley catheter  is placed in the bladder as a drain and she is draped in a  sterile fashion.  After testing for adequate spinal anesthesia, skin is entered through a  Pfannenstiel incision and dissection is carried out in layers to the  peritoneum.  Peritoneum is incised and extended superiorly and inferiorly.  Vesicouterine peritoneum is taken down cephalolaterally.  The bladder flap  is developed and the bladder blade is placed.  Uterus is incised in a low  transverse manner.  The uterine cavity is entered bluntly with a hemostat.  The uterine incision is extended cephalolaterally with the fingers.  Artificial rupture of membranes for clear fluid is carried out.  Baby is in  the single footling breech presentation.  It is lying on its left side with  the right leg down and the left leg up.  The baby is delivered without  difficulty.  Oro and nasopharynx are suctioned.  Cord is clamped and cut and  the infant  is handed to the waiting pediatrics team.  Placenta is manually  delivered.  Uterine cavity is clean and normal in contour.  Uterus is closed  in a running locking fashion with a 0 Monocryl suture starting at each angle  and then meeting on the right-hand side of the incision.  Good hemostasis is  obtained with a single figure-of-eight suture on the right-hand aspect of  the incision.  Tubes and ovaries are normal.  Anterior peritoneum is closed  in a running fashion with 0 Monocryl suture which is also used to  reapproximate the pyramidalis muscle in the midline.  Anterior rectus fascia  is closed in a running fashion with 0 PDS suture and the skin is closed with  clips.  All sponge, instrument, needle counts are correct and the patient is  transferred to recovery room in stable condition.                                               Guy Sandifer Arleta Creek, M.D.    JET/MEDQ  D:  04/09/2002  T:  04/09/2002  Job:  [id number]

## 2010-11-18 NOTE — Discharge Summary (Signed)
[name], [name]                          ACCOUNT NO.:  [id number]   MEDICAL RECORD NO.:  [id number]                   PATIENT TYPE:  INP   LOCATION:  9112                                 [facility]ACILITY:  [facility]   PHYSICIAN:  Tracie Harrier, M.D.              DATE OF BIRTH:  1966/03/07   DATE OF ADMISSION:  04/09/2002  DATE OF DISCHARGE:  04/12/2002                                 DISCHARGE SUMMARY   ADMITTING DIAGNOSES:  1. Intrauterine pregnancy at 80 and 5/7 weeks estimated gestational age.  2. Gestational diabetes.  3. Breech presentation.   DISCHARGE DIAGNOSES:  1. Status post low transverse cesarean delivery.  2. Viable female infant.   PROCEDURE:  Primary low transverse cesarean section.   REASON FOR ADMISSION:  Please see dictated H&P.   HOSPITAL COURSE:  The patient was admitted to the hospital for a scheduled  cesarean delivery.  The patient with known breech presentation and  explanation had been given to patient and patient elected for scheduled  cesarean delivery.  The patient was taken to the operating room where spinal  anesthesia was administered without difficulty.  A low transverse incision  was made with delivery of a viable female infant weighing 8 pounds 3 ounces  with Apgars of 5 at one minute and 9 at five minutes.  Arterial cord pH was  7.31.  The patient tolerated procedure well and was taken to the recovery  room in stable condition.  On postoperative day one patient had good return  of bowel function.  However, patient did complain of some right scapular  pain which increased with inspiration.  The patient did also complain of an  occasional right upper quadrant pain.  Vital signs were stable with blood  pressure 90/60.  Abdomen was soft.  Bowel sounds were auscultated.  Lungs  were clear to auscultation.  Deep tendon reflexes were 2+ without clonus.  Abdominal dressing was partially removed and incision was noted to be clean,  dry, and intact.  Urine  output was adequate.  Laboratories revealed  hemoglobin of 12.2, platelet count of 166,000, WBC count of 11.6.  On  postoperative day two some scapular pain continued which was intermittent.  Abdomen was soft.  Fundus was firm.  Lungs continued to be clear to  auscultation.  The patient was tolerating a regular diet without complaints  of nausea and vomiting.  RhoGAM was administered.  On postoperative day  three patient had completely resolved.  Incision was clean, dry, and intact.  Staples were removed and patient was discharged home.   CONDITION ON DISCHARGE:  Good.   DIET:  Regular, as tolerated.   ACTIVITY:  No heavy lifting.  No driving x2 weeks.  No vaginal entry.   FOLLOW UP:  The patient is to follow up in the office in one to two weeks  for an incision check.  She is to call  for temperature greater than 100  degrees, persistent nausea and vomiting, heavy vaginal bleeding, and/or  redness or drainage from the incision site.   DISCHARGE MEDICATIONS:  1. Tylox number 40 one p.o. q.4-6h. p.r.n. pain.  2. Phenergan 25 mg number 10 one p.o. q.6-8h. p.r.n. nausea.  3. Colace one p.o. daily p.r.n.  4. Prenatal vitamins one p.o. daily.     Julio Sicks, N.P.                        Tracie Harrier, M.D.    CC/MEDQ  D:  05/07/2002  T:  05/07/2002  Job:  [id number]

## 2010-11-18 NOTE — H&P (Signed)
   NAME:  [name], [name]                          ACCOUNT NO.:  [id number]   MEDICAL RECORD NO.:  [id number]                   PATIENT TYPE:  INP   LOCATION:  NA                                   [facility]   PHYSICIAN:  Guy Sandifer. Arleta Creek, M.D.           DATE OF BIRTH:  1965/07/13   DATE OF ADMISSION:  04/09/2002  DATE OF DISCHARGE:                                HISTORY & PHYSICAL   CHIEF COMPLAINT:  Breech baby.   HISTORY OF PRESENT ILLNESS:  The patient is a 45 year old married female G1  P0 with an EDC of April 18, 2002 who has a breech presentation confirmed  on ultrasound.  The pregnancy has also been complicated by advanced maternal  age with a normal amniocentesis, and gestational diabetes with good diet  control.  After a discussion of the options of management the patient is  being admitted for cesarean section.  Potential risks and complications have  been discussed preoperatively.   PAST MEDICAL HISTORY:  See [place] form.   PAST SURGICAL HISTORY:  See [place] form.   FAMILY HISTORY:  See [place] form.   OBSTETRICAL HISTORY:  See [place] form.   MEDICATIONS:  Prenatal vitamins.   ALLERGIES:  No known drug allergies   SOCIAL HISTORY:  The patient denies tobacco, alcohol, or drug abuse.   REVIEW OF SYSTEMS:  Negative except as above.   PHYSICAL EXAMINATION:  VITAL SIGNS:  Height 5 feet 5 inches, weight 192  pounds.  Blood pressure 110/70.  HEENT:  Without thyromegaly.  LUNGS:  Clear to auscultation.  HEART:  Regular rate and rhythm.  BACK:  Without CVA tenderness.  BREASTS:  Not examined.  ABDOMEN:  Gravid, nontender.  PELVIC:  Cervix closed, thick, and high.  EXTREMITIES AND NEUROLOGIC:  Grossly within normal limits.   ASSESSMENT:  1. Intrauterine pregnancy at 61 and five-sevenths weeks.  2.     Gestational diabetes.  3. Breech presentation.   PLAN:  Cesarean section.                                               Guy Sandifer Arleta Creek,  M.D.    JET/MEDQ  D:  04/09/2002  T:  04/09/2002  Job:  [id number]

## 2012-02-09 ENCOUNTER — Encounter: Payer: Self-pay | Admitting: Physician Assistant

## 2012-02-09 ENCOUNTER — Ambulatory Visit (INDEPENDENT_AMBULATORY_CARE_PROVIDER_SITE_OTHER): Payer: BC Managed Care – PPO | Admitting: Physician Assistant

## 2012-02-09 VITALS — BP 116/70 | HR 106 | Temp 99.6°F | Resp 17 | Ht 64.5 in | Wt 215.0 lb

## 2012-02-09 DIAGNOSIS — R059 Cough, unspecified: Secondary | ICD-10-CM

## 2012-02-09 DIAGNOSIS — R05 Cough: Secondary | ICD-10-CM

## 2012-02-09 DIAGNOSIS — J069 Acute upper respiratory infection, unspecified: Secondary | ICD-10-CM

## 2012-02-09 MED ORDER — CEFDINIR 300 MG PO CAPS: 600.0000 mg | ORAL_CAPSULE | Freq: Every day | ORAL | Status: AC

## 2012-02-09 MED ORDER — HYDROCOD POLST-CHLORPHEN POLST 10-8 MG/5ML PO LQCR: 5.0000 mL | Freq: Two times a day (BID) | ORAL | Status: DC | PRN

## 2012-02-09 MED ORDER — IPRATROPIUM BROMIDE 0.03 % NA SOLN: 2.0000 | Freq: Two times a day (BID) | NASAL | Status: DC

## 2012-02-09 NOTE — Patient Instructions (Signed)
Get plenty of rest and drink at least 64 ounces of water daily.  Start the antibiotic on day 6 or 7 of your illness if you have not begun to improve.  Continue the Mucinex (take 1200 mg every 12 hours).

## 2012-02-09 NOTE — Progress Notes (Signed)
  Subjective:    Patient ID: [name], female    DOB: September 19, 1965, 46 y.o.   MRN: [id number]  HPI This 46 y.o. female presents for evaluation of cough.  Last night about 10 pm developed mild cough, scratchy throat, sneezing.  This morning awoke early with cough productive of yellow sputum, ears popping, sneezing.  No chills, N/V.  Temperature 100 this am.  OTC cough preparation without benefit. Alternately stuffy and runny nose.  Eyes are itchy.  HA with coughing.  No diarrhea.  No unexplained myalgias.  No rash.  "When I get this, I get bronchitis."  Review of Systems As above.     Objective:   Physical Exam  Blood pressure 116/70, pulse 106, temperature 99.6 F (37.6 C), temperature source Oral, resp. rate 17, height 5' 4.5" (1.638 m), weight 215 lb (97.523 kg), last menstrual period 01/17/2012, SpO2 96.00%. Body mass index is 36.33 kg/(m^2). Well-developed, well nourished WF who is awake, alert and oriented, in NAD. HEENT: [place]/AT, sclera and conjunctiva are clear.  EAC are patent, TMs are normal in appearance. Nasal mucosa is pink and moist. OP is clear. Neck: supple, non-tender, no lymphadenopathy, thyromegaly. Heart: RRR, no murmur Lungs: normal effort, CTA Extremities: no cyanosis, clubbing or edema. Skin: warm and dry without rash.     Assessment & Plan:   1. Acute upper respiratory infections of unspecified site  ipratropium (ATROVENT) 0.03 % nasal spray, cefdinir (OMNICEF) 300 MG capsule  2. Cough  chlorpheniramine-HYDROcodone (TUSSIONEX PENNKINETIC ER) 10-8 MG/5ML LQCR   She'll hold the cefdinir and start it only if she hasn't begun to improve by day 6-7 of her illness.  Supportive care.

## 2013-04-19 ENCOUNTER — Ambulatory Visit (INDEPENDENT_AMBULATORY_CARE_PROVIDER_SITE_OTHER): Payer: BC Managed Care – PPO | Admitting: Family Medicine

## 2013-04-19 VITALS — BP 112/64 | HR 99 | Temp 98.1°F | Resp 18 | Ht 65.0 in | Wt 202.0 lb

## 2013-04-19 DIAGNOSIS — IMO0001 Reserved for inherently not codable concepts without codable children: Secondary | ICD-10-CM

## 2013-04-19 DIAGNOSIS — J209 Acute bronchitis, unspecified: Secondary | ICD-10-CM

## 2013-04-19 DIAGNOSIS — E669 Obesity, unspecified: Secondary | ICD-10-CM

## 2013-04-19 DIAGNOSIS — E785 Hyperlipidemia, unspecified: Secondary | ICD-10-CM

## 2013-04-19 MED ORDER — HYDROCODONE-HOMATROPINE 5-1.5 MG/5ML PO SYRP: 5.0000 mL | ORAL_SOLUTION | Freq: Three times a day (TID) | ORAL | Status: DC | PRN

## 2013-04-19 MED ORDER — AZITHROMYCIN 250 MG PO TABS: ORAL_TABLET | ORAL | Status: DC

## 2013-04-19 NOTE — Progress Notes (Signed)
  Subjective:  This chart was scribed for Elvina Sidle, MD by Arlan Organ, ED Scribe. This patient was seen in room Room 5 and the patient's care was started 9:09 AM.    Patient ID: [name], female    DOB: [birth year], 47 y.o.   MRN: [id number]  HPI HPI Comments: [name] is a 47 y.o. female who presents to [facility] complaining of congestion, productive cough consisting of yellow sputum, and ear pain that started 10/14. Pt states the congestion progressed into her chest throughout the week. She states she has had a hard time sleeping due to her symptoms. Pt denies any soreness in her throat or fever. Pt denies ever having asthma.  Pt denies ever smoking. Pt denies being allergic to any antibiotics. 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   Review of Systems  Constitutional: Negative for fever.  HENT: Positive for congestion and ear pain. Negative for sore throat.   Respiratory: Positive for cough.        Objective:   Physical Exam  Nursing note and vitals reviewed. Constitutional: She is oriented to person, place, and time. She appears well-developed and well-nourished.  HENT:  Head: Normocephalic and atraumatic.  Eyes: EOM are normal.  Neck: Normal range of motion.  Cardiovascular: Normal rate.   Pulmonary/Chest: Effort normal.  Rhonchi   Musculoskeletal: Normal range of motion.  Neurological: She is alert and oriented to person, place, and time.  Skin: Skin is warm and dry.  Psychiatric: She has a normal mood and affect. Her behavior is normal.   Patient having no problems with the simvastatin Blood sugars controlled.      Assessment & Plan:  Acute bronchitis - Plan: HYDROcodone-homatropine (HYCODAN) 5-1.5 MG/5ML syrup, azithromycin (ZITHROMAX Z-PAK) 250 MG tablet  Signed, Elvina Sidle, MD

## 2013-04-19 NOTE — Patient Instructions (Signed)

## 2013-07-03 ENCOUNTER — Ambulatory Visit: Payer: BC Managed Care – PPO | Admitting: Physician Assistant

## 2013-07-03 VITALS — BP 118/76 | HR 92 | Temp 98.3°F | Resp 18 | Ht 65.0 in | Wt 203.6 lb

## 2013-07-03 DIAGNOSIS — G4730 Sleep apnea, unspecified: Secondary | ICD-10-CM

## 2013-07-03 DIAGNOSIS — R059 Cough, unspecified: Secondary | ICD-10-CM

## 2013-07-03 DIAGNOSIS — E119 Type 2 diabetes mellitus without complications: Secondary | ICD-10-CM | POA: Insufficient documentation

## 2013-07-03 DIAGNOSIS — G4733 Obstructive sleep apnea (adult) (pediatric): Secondary | ICD-10-CM | POA: Insufficient documentation

## 2013-07-03 DIAGNOSIS — G4700 Insomnia, unspecified: Secondary | ICD-10-CM | POA: Insufficient documentation

## 2013-07-03 DIAGNOSIS — E785 Hyperlipidemia, unspecified: Secondary | ICD-10-CM | POA: Insufficient documentation

## 2013-07-03 DIAGNOSIS — Z9989 Dependence on other enabling machines and devices: Secondary | ICD-10-CM

## 2013-07-03 DIAGNOSIS — K219 Gastro-esophageal reflux disease without esophagitis: Secondary | ICD-10-CM | POA: Insufficient documentation

## 2013-07-03 DIAGNOSIS — R05 Cough: Secondary | ICD-10-CM

## 2013-07-03 DIAGNOSIS — J40 Bronchitis, not specified as acute or chronic: Secondary | ICD-10-CM

## 2013-07-03 MED ORDER — DOXYCYCLINE HYCLATE 100 MG PO CAPS: 100.0000 mg | ORAL_CAPSULE | Freq: Two times a day (BID) | ORAL | Status: DC

## 2013-07-03 MED ORDER — ALBUTEROL SULFATE HFA 108 (90 BASE) MCG/ACT IN AERS: 2.0000 | INHALATION_SPRAY | RESPIRATORY_TRACT | Status: DC | PRN

## 2013-07-03 MED ORDER — HYDROCOD POLST-CHLORPHEN POLST 10-8 MG/5ML PO LQCR: 5.0000 mL | Freq: Two times a day (BID) | ORAL | Status: DC | PRN

## 2013-07-03 NOTE — Progress Notes (Signed)
   Subjective:    Patient ID: [name], female    DOB: [birth year], 48 y.o.   MRN: [id number]  PCP: Hoyle SauerAVVA,RAVISANKAR R, MD  Chief Complaint  Patient presents with  . Cough    Started Sunday  . Nasal Congestion   Medications, allergies, past medical history, surgical history, family history, social history and problem list reviewed and updated.  HPI  Began with a scratchy cough .  Light green sputum. Son recently recovered from pneumonia. Had bronchitis 10 weeks ago, which completely resolved. "I have energy, no fever, nothing, it's just in the chest." "It feels like when I had bronchitis before." No nasal/sinus congestion drainage pressure, ear clicking. Throat is not sore.  Review of Systems As above. No GU/GI symptoms.    Objective:   Physical Exam  Blood pressure 118/76, pulse 92, temperature 98.3 F (36.8 C), temperature source Oral, resp. rate 18, height 5\' 5"  (1.651 m), weight 203 lb 9.6 oz (92.352 kg), last menstrual period 07/02/2013, SpO2 97.00%. Body mass index is 33.88 kg/(m^2). Well-developed, well nourished WF who is awake, alert and oriented, in NAD. HEENT: [place]/AT, PERRL, EOMI.  Sclera and conjunctiva are clear.  EAC are patent, TMs are normal in appearance. Nasal mucosa is pink and moist. OP is clear. Neck: supple, non-tender, no lymphadenopathy, thyromegaly. Heart: RRR, no murmur Lungs: normal effort, CTA Extremities: no cyanosis, clubbing or edema. Skin: warm and dry without rash. Psychologic: good mood and appropriate affect, normal speech and behavior.       Assessment & Plan:  1. Cough - chlorpheniramine-HYDROcodone (TUSSIONEX PENNKINETIC ER) 10-8 MG/5ML LQCR; Take 5 mLs by mouth every 12 (twelve) hours as needed (cough).  Dispense: 80 mL; Refill: 0  2. Bronchitis - albuterol (PROVENTIL HFA;VENTOLIN HFA) 108 (90 BASE) MCG/ACT inhaler; Inhale 2 puffs into the lungs every 4 (four) hours as needed (cough, shortness of breath or wheezing.).   Dispense: 1 Inhaler; Refill: 1 - doxycycline (VIBRAMYCIN) 100 MG capsule; Take 1 capsule (100 mg total) by mouth 2 (two) times daily.  Dispense: 20 capsule; Refill: 0  Patient Instructions  Get plenty of rest and drink at least 64 ounces of water daily.  If your symptoms do not begin to improve in the next 24-48 hours, pick up the prescription for the antibiotic, Doxycycline.   Fernande Brashelle S. Elkin Belfield, PA-C Physician Assistant-Certified [facility]

## 2013-07-03 NOTE — Patient Instructions (Signed)
Get plenty of rest and drink at least 64 ounces of water daily.  If your symptoms do not begin to improve in the next 24-48 hours, pick up the prescription for the antibiotic, Doxycycline.

## 2013-07-19 ENCOUNTER — Ambulatory Visit: Payer: BC Managed Care – PPO

## 2013-07-19 ENCOUNTER — Ambulatory Visit: Payer: BC Managed Care – PPO | Admitting: Emergency Medicine

## 2013-07-19 VITALS — BP 120/84 | HR 84 | Temp 98.8°F | Resp 16 | Ht 64.5 in | Wt 201.4 lb

## 2013-07-19 DIAGNOSIS — R059 Cough, unspecified: Secondary | ICD-10-CM

## 2013-07-19 DIAGNOSIS — R05 Cough: Secondary | ICD-10-CM

## 2013-07-19 DIAGNOSIS — J189 Pneumonia, unspecified organism: Secondary | ICD-10-CM

## 2013-07-19 LAB — POCT INFLUENZA A/B
INFLUENZA A, POC: NEGATIVE
INFLUENZA B, POC: NEGATIVE

## 2013-07-19 LAB — POCT CBC
Granulocyte percent: 76 %G (ref 37–80)
HEMATOCRIT: 43.5 % (ref 37.7–47.9)
HEMOGLOBIN: 13.7 g/dL (ref 12.2–16.2)
LYMPH, POC: 1.3 (ref 0.6–3.4)
MCH: 30.1 pg (ref 27–31.2)
MCHC: 31.5 g/dL — AB (ref 31.8–35.4)
MCV: 95.7 fL (ref 80–97)
MID (cbc): 0.4 (ref 0–0.9)
MPV: 9 fL (ref 0–99.8)
POC Granulocyte: 5.3 (ref 2–6.9)
POC LYMPH %: 18.4 % (ref 10–50)
POC MID %: 5.6 %M (ref 0–12)
Platelet Count, POC: 268 10*3/uL (ref 142–424)
RBC: 4.55 M/uL (ref 4.04–5.48)
RDW, POC: 13.8 %
WBC: 7 10*3/uL (ref 4.6–10.2)

## 2013-07-19 MED ORDER — LEVOFLOXACIN 500 MG PO TABS: 500.0000 mg | ORAL_TABLET | Freq: Every day | ORAL | Status: AC

## 2013-07-19 NOTE — Patient Instructions (Signed)

## 2013-07-19 NOTE — Progress Notes (Signed)
Subjective:   This chart was scribed for Earl Lites MD by Arlan Organ, ED Scribe. This patient was seen in room Room 10 and the patient's care was started 8:27 AM.    Patient ID: [name], female    DOB: 01-Aug-1965, 48 y.o.   MRN: [id number]  HPI HPI Comments: [name] is a 48 y.o. female who presents to [facility] complaining of a URI that initially started about 2 weeks ago that resolved, but has some what returned 3 days ago. She states she was seen 1/1 here at [facility], and was dx with bronchitis. She states she was given doxycycline with much improvement for her symptoms. However, she now reports a gradual onset, gradually worsening, constant, moderate productive cough consisting of thick green mucous, mild otalgia, and a fever of 101 at its highest that has now resolved. She denies trying anything OTC for her new symptoms. She states her son recently had walking pneumonia, but improved around the last week of December 2014. She denies a h/o bronchitis.  She denies having an X-Ray during her last visit. She denies currently being a smoker.  Review of Systems  Constitutional: Positive for fever.  HENT: Positive for ear pain.   Respiratory: Positive for cough.     Past Medical History  Diagnosis Date  . Pancreatitis   . Diabetes mellitus   . Bronchitis   . GERD (gastroesophageal reflux disease)   . Sleep apnea   . Acute pancreatitis 09/30/2010  . Ulcer     History   Social History  . Marital Status: Married    Spouse Name: Ron    Number of Children: 4  . Years of Education: 18   Occupational History  . unemployed    Social History Main Topics  . Smoking status: Never Smoker   . Smokeless tobacco: Never Used  . Alcohol Use: No  . Drug Use: No  . Sexual Activity: Yes    Partners: Male    Birth Control/ Protection: Pill   Other Topics Concern  . Not on file   Social History Narrative   One biological son, 3 step children    Past Surgical History  Procedure  Laterality Date  . Cesarean section    . Bunionectomy    . Wisdom tooth extraction  1986    Triage Vitals: BP 120/84  Pulse 84  Temp(Src) 98.8 F (37.1 C) (Oral)  Resp 16  Ht 5' 4.5" (1.638 m)  Wt 201 lb 6.4 oz (91.354 kg)  BMI 34.05 kg/m2  SpO2 98%  LMP 07/02/2013   Objective:  Physical Exam CONSTITUTIONAL: Well developed/well nourished HEAD: Normocephalic/atraumatic EYES: EOMI/PERRL ENMT: Mucous membranes moist NECK: supple no meningeal signs SPINE:entire spine nontender CV: S1/S2 noted, no murmurs/rubs/gallops noted LUNGS: There are rales present in the right lower lobe. Her sons are symmetrical. There are no areas of dullness. ABDOMEN: soft, nontender, no rebound or guarding GU:no cva tenderness NEURO: Pt is awake/alert, moves all extremitiesx4 EXTREMITIES: pulses normal, full ROM SKIN: warm, color normal PSYCH: no abnormalities of mood noted  [facility] reading (PRIMARY) by  Dr. Cleta Alberts   increased markings in the right lower lobe Results for orders placed in visit on 07/19/13  POCT INFLUENZA A/B      Result Value Range   Influenza A, POC Negative     Influenza B, POC Negative    POCT CBC      Result Value Range   WBC 7.0  4.6 - 10.2 K/uL   Lymph,  poc 1.3  0.6 - 3.4   POC LYMPH PERCENT 18.4  10 - 50 %L   MID (cbc) 0.4  0 - 0.9   POC MID % 5.6  0 - 12 %M   POC Granulocyte 5.3  2 - 6.9   Granulocyte percent 76.0  37 - 80 %G   RBC 4.55  4.04 - 5.48 M/uL   Hemoglobin 13.7  12.2 - 16.2 g/dL   HCT, POC 09.843.5  11.937.7 - 47.9 %   MCV 95.7  80 - 97 fL   MCH, POC 30.1  27 - 31.2 pg   MCHC 31.5 (*) 31.8 - 35.4 g/dL   RDW, POC 14.713.8     Platelet Count, POC 268  142 - 424 K/uL   MPV 9.0  0 - 99.8 fL    Assessment & Plan:  We'll treat with one week of Levaquin. She has Tussionex at night to take and she has an inhaler at home to use

## 2013-08-02 ENCOUNTER — Ambulatory Visit: Payer: BC Managed Care – PPO

## 2013-08-02 ENCOUNTER — Ambulatory Visit: Payer: BC Managed Care – PPO | Admitting: Family Medicine

## 2013-08-02 VITALS — BP 120/70 | HR 95 | Temp 98.5°F | Resp 16 | Ht 65.5 in | Wt 202.8 lb

## 2013-08-02 DIAGNOSIS — L989 Disorder of the skin and subcutaneous tissue, unspecified: Secondary | ICD-10-CM

## 2013-08-02 DIAGNOSIS — J329 Chronic sinusitis, unspecified: Secondary | ICD-10-CM

## 2013-08-02 MED ORDER — AMOXICILLIN-POT CLAVULANATE 875-125 MG PO TABS
1.0000 | ORAL_TABLET | Freq: Two times a day (BID) | ORAL | Status: DC
Start: 2013-08-02 — End: 2015-10-15

## 2013-08-02 NOTE — Patient Instructions (Signed)
Continue the fluticasone nose spray daily 2 sprays each nostril  Take the Augmentin one twice daily with food,breakfast and supper. Continue it for the 2 full courses tolerating well.  Drink lots of fluids to keep well hydrated.  If skin lesion on leg worse please return

## 2013-08-02 NOTE — Progress Notes (Signed)
Subjective: 48 year old lady who has been fighting a respiratory tract infection a month. She just finished a course of Levaquin for a possible bibasilar pneumonia. The radiologist seems to think this was more atelectasis. The patient's sister, who is a Engineer, civil (consulting)nurse, encouraged her to consider getting a repeat chest x-ray since she continues to cough. She feels a irritation in the central portion of her chest for her cough. She continues to have a lot of drainage from her nose blowing out purulent mucus. She does not smoke. She's not had any fevers. She does have a raised spot on her right leg just above the medial malleolus that has been there 9 days. She doesn't know what caused it.  Objective: Pleasant alert lady in no major distress. She does cough some and clears her throat occasionally from postnasal drainage. Her sinuses are nontender. Neck supple without significant nodes. Chest is clear to auscultation. Heart regular without murmurs. She has a 1.5 cm erythematous area medial right lower extremity which is a little bit indurated. Not definite cyst. It is not coming to a head at all. The color looks like a resolving type lesions.  Assessment: Sinusitis/bronchitis, persistent Post viral cough Dermatosis right lower extremity, possibly resolving superficial skin abscess  Plan: Sinus x-ray  [facility] reading (PRIMARY) by  Dr. Alwyn RenHopper Maxillary sinusisitis right more than left. Frontal clear  Assessment: Maxillary sinusitis Skin lesion ankle.

## 2013-08-06 ENCOUNTER — Ambulatory Visit (INDEPENDENT_AMBULATORY_CARE_PROVIDER_SITE_OTHER): Payer: BC Managed Care – PPO | Admitting: Family Medicine

## 2013-08-06 VITALS — BP 120/82 | HR 94 | Temp 98.2°F | Resp 16 | Ht 63.0 in | Wt 199.6 lb

## 2013-08-06 DIAGNOSIS — L0291 Cutaneous abscess, unspecified: Secondary | ICD-10-CM

## 2013-08-06 DIAGNOSIS — L0390 Cellulitis, unspecified: Secondary | ICD-10-CM

## 2013-08-06 MED ORDER — MUPIROCIN 2 % EX OINT: 1.0000 "application " | TOPICAL_OINTMENT | Freq: Three times a day (TID) | CUTANEOUS | Status: DC

## 2013-08-06 MED ORDER — DOXYCYCLINE HYCLATE 100 MG PO CAPS: 100.0000 mg | ORAL_CAPSULE | Freq: Two times a day (BID) | ORAL | Status: DC

## 2013-08-06 NOTE — Addendum Note (Signed)
Addended by: Braxton FeathersJAIMEZ, Dallis Czaja on: 08/06/2013 01:26 PM   Modules accepted: Orders

## 2013-08-06 NOTE — Patient Instructions (Signed)
Continue the Augmentin.  Begin doxycycline one twice daily  Used the mupirocin ointment twice daily on the wound.  Keep the wound covered to avoid any pus draining elsewhere.  If worse return for recheck

## 2013-08-06 NOTE — Progress Notes (Signed)
Subjective: Patient reports that her sinuses are feeling much better.  She is in here today because she's developed a sore place on her right lower leg medially. 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 She is diabetic.  Objective: 2-3 cm diameter area of the finger with a central point to it. It is not very fluctuant. It is tender I curetted the little scab off of it and a tiny amount of drainage I could squeeze out.  Assessment: Small cutaneous abscess, suspicious for MRSA, especially since your he is on Augmentin  Plan: Mupirocin Doxycycline 100 twice a day Return if getting worse

## 2013-08-09 LAB — WOUND CULTURE
Gram Stain: NONE SEEN
Gram Stain: NONE SEEN
Gram Stain: NONE SEEN
Organism ID, Bacteria: NO GROWTH

## 2013-11-14 ENCOUNTER — Telehealth: Payer: Self-pay | Admitting: Genetic Counselor

## 2013-11-14 NOTE — Telephone Encounter (Signed)
CALLED PATIENT TO SCHEDULE GENETIC APPT PER PATIENT NEED AN APPT IN JULY NOTIFIED PATIENT WILL CALL BACK WITH APPT.

## 2013-12-01 ENCOUNTER — Telehealth: Payer: Self-pay | Admitting: Genetic Counselor

## 2013-12-01 NOTE — Telephone Encounter (Signed)
LEFT MESSAGE FOR PATIENT TO RETURN CALL TO SCHEDULE GENETIC APPT.  °

## 2013-12-02 ENCOUNTER — Telehealth: Payer: Self-pay | Admitting: Genetic Counselor

## 2013-12-02 NOTE — Telephone Encounter (Signed)
LEFT MESSAGE FOR PATIENT TO RETURN CALL TO SCHEDULE GENETIC APPT.  °

## 2013-12-03 ENCOUNTER — Telehealth: Payer: Self-pay | Admitting: Genetic Counselor

## 2013-12-03 NOTE — Telephone Encounter (Signed)
S/W PATIENT AND GAVE GENETIC APPT FOR 07/15 @ 10 W/GENETIC COUNSELOR.  REFERRING DR. Henderson Cloud DX- FHX OF BREAST CA WELCOME PACKET MAILED.

## 2014-01-14 ENCOUNTER — Ambulatory Visit (HOSPITAL_BASED_OUTPATIENT_CLINIC_OR_DEPARTMENT_OTHER): Payer: BC Managed Care – PPO | Admitting: Genetic Counselor

## 2014-01-14 ENCOUNTER — Encounter: Payer: Self-pay | Admitting: Genetic Counselor

## 2014-01-14 ENCOUNTER — Telehealth: Payer: Self-pay | Admitting: Family Medicine

## 2014-01-14 ENCOUNTER — Telehealth: Payer: Self-pay

## 2014-01-14 ENCOUNTER — Other Ambulatory Visit: Payer: BC Managed Care – PPO

## 2014-01-14 DIAGNOSIS — Z803 Family history of malignant neoplasm of breast: Secondary | ICD-10-CM

## 2014-01-14 DIAGNOSIS — IMO0002 Reserved for concepts with insufficient information to code with codable children: Secondary | ICD-10-CM

## 2014-01-14 NOTE — Progress Notes (Signed)
Patient Name: [name] Patient Age: 48 y.o. Encounter Date: 01/14/2014  Referring Physician: Everlene Farrier, MD  Primary Care Provider: Tivis Ringer, MD   [name], a 48 y.o. female, is being seen at the [facility] due to a family history of breast cancer.  She presents to clinic today to discuss the possibility of a hereditary predisposition to cancer and discuss whether genetic testing is warranted.  HISTORY OF PRESENT ILLNESS: [name] has no personal history of cancer and is in generally good health. She stated that she has a yearly gynecologic exam, clinical breast exam and mammogram. She had a colonoscopy in 2012 and states that 2-3 polyps were removed. We do not have documentation today as to the type of polyps. She was told to have her next colonoscopy at age 77.  Past Medical History  Diagnosis Date  . Pancreatitis   . Diabetes mellitus   . Bronchitis   . GERD (gastroesophageal reflux disease)   . Sleep apnea   . Acute pancreatitis 09/30/2010  . Ulcer   . Family history of malignant neoplasm of breast     Past Surgical History  Procedure Laterality Date  . Cesarean section    . Bunionectomy    . Wisdom tooth extraction  1986    History   Social History  . Marital Status: Married    Spouse Name: Ron    Number of Children: 4  . Years of Education: 18   Occupational History  . unemployed    Social History Main Topics  . Smoking status: Never Smoker   . Smokeless tobacco: Never Used  . Alcohol Use: No  . Drug Use: No  . Sexual Activity: Yes    Partners: Male    Birth Control/ Protection: Pill   Other Topics Concern  . Not on file   Social History Narrative   One biological son, 3 step children     FAMILY HISTORY:   During the visit, a 4-generation pedigree was obtained. [name] has one son (age 22). She has two sisters (ages 35 and 53). Her older sister had melanoma on her shoulder in her late 42s. Her mother is 48  with a history of breast cancer at age 88. Her mother had a TAH/BSO at age ~2. Her mother was an only child. Her maternal grandparents died in their late 73s, cancer-free. Her father is 21 and cancer-free. He had only one brother who is 96 and cancer-free as are his three children. Her paternal grandmother died in her 32 due to complications from diabetes and her paternal grandfather died in his 44s due to colon cancer.  [name] ancestry is Caucasian - NOS. There is no known Jewish ancestry and no consanguinity.  ASSESSMENT AND PLAN: [name] is a 48 y.o. female with a family history of late age-onset breast cancer in her mother at age 58. While this history is not suggestive of a hereditary predisposition to cancer, her mother did have a BSO at a young age and was an only child. Due to this, we discussed BRCA1 and BRCA2 genetic testing, but explained that her risk of harboring a mutation in either of these genes is not high. We reviewed the characteristics, features and inheritance patterns of hereditary cancer syndromes. We also discussed genetic testing, including the process of testing, insurance coverage and implications of results.   [name] wished to pursue genetic testing and a blood sample will be sent to [facility] for analysis  of the BRCA1 and BRCA2 genes. We discussed the implications of a positive, negative and/ or Variant of Uncertain Significance (VUS) result. Results should be available in approximately 2-3 weeks, at which point we will contact her and address implications for her as well as address genetic testing for at-risk family members, if needed.    We encouraged [name] to remain in contact with Cancer Genetics annually so that we can update the family history and inform her of any changes in cancer genetics and testing that may be of benefit for this family. [name] questions were answered to her satisfaction today.   Thank you for the referral and allowing  [place] to share in the care of your patient.   The patient was seen for a total of 30 minutes, greater than 50% of which was spent face-to-face counseling. This patient was discussed with the overseeing provider who agrees with the above.   [name], MS, [place] Certified Genetic Counseor phone: [phone] [email or web address]

## 2014-01-14 NOTE — Telephone Encounter (Signed)
Left message on machine for patient to give us a call back about who her PCP was and to follow up with us about her labs if it is us

## 2014-01-14 NOTE — Telephone Encounter (Signed)
Spoke with patient her PCP is Dr Larina Earthlyavi Avva

## 2014-01-14 NOTE — Telephone Encounter (Signed)
LMOM to call back

## 2014-01-29 ENCOUNTER — Encounter: Payer: Self-pay | Admitting: Genetic Counselor

## 2014-01-29 NOTE — Progress Notes (Signed)
Referring Physician: Everlene Farrier, MD   [name] was called today to discuss genetic test results. Please see the Genetics note from her visit on 01/14/14 for a detailed discussion of her personal and family history.  GENETIC TESTING: At the time of [name]'s visit, we recommended she pursue genetic testing of the BRCA1 and BRCA2 genes. This test, which included sequencing and deletion/duplication analysis, was performed at [facility]. Testing was normal and did not reveal a mutation in these genes.   We discussed with [name] that since the current test is not perfect, it is possible there may be a gene mutation that current testing cannot detect, but that chance is small. We also discussed that it is possible that a different genetic factor, which was not part of this testing or has not yet been discovered, is responsible for the cancer diagnoses in the family. Again, the chance of this is also small.  CANCER SCREENING: This normal result is reassuring and indicates that [name] does not likely have an increased risk of cancer due to a mutation in one of these genes. She understood her risk of breast cancer is elevated above the general population risk simply due to her family history. We recommended [name] continue to follow the cancer screening guidelines provided by her primary physician.   FAMILY MEMBERS: While these results are reassuring for [name], this test does not tell us anything about [name]'s sisters' genetic status. We recommended these relatives also consider having genetic counseling and testing. Please let us know if we can help facilitate testing. Genetic counselors can be located in other cities, by visiting the website of the [employer] ([employer]ist[employer].se) and Field seismologist for a Dietitian by zip code.    Lastly, we discussed with [name] that cancer genetics is a rapidly advancing field and it is possible that new genetic  tests will be appropriate for her in the future. We encouraged her to remain in contact with [place] on an annual basis so we can update her personal and family histories, and let her know of advances in cancer genetics that may benefit the family. Our contact number was provided. [name] questions were answered to her satisfaction today, and she knows she is welcome to call anytime with additional questions.    [name], MS, [place] Certified Genetic Counseor phone: [phone] [name]

## 2014-09-03 NOTE — Telephone Encounter (Signed)
Error

## 2015-01-12 ENCOUNTER — Ambulatory Visit (INDEPENDENT_AMBULATORY_CARE_PROVIDER_SITE_OTHER): Payer: BLUE CROSS/BLUE SHIELD

## 2015-01-12 ENCOUNTER — Encounter: Payer: Self-pay | Admitting: Podiatry

## 2015-01-12 ENCOUNTER — Ambulatory Visit (INDEPENDENT_AMBULATORY_CARE_PROVIDER_SITE_OTHER): Payer: BLUE CROSS/BLUE SHIELD | Admitting: Podiatry

## 2015-01-12 VITALS — BP 112/71 | HR 105 | Resp 15

## 2015-01-12 DIAGNOSIS — M722 Plantar fascial fibromatosis: Secondary | ICD-10-CM

## 2015-01-12 MED ORDER — TRIAMCINOLONE ACETONIDE 10 MG/ML IJ SUSP
10.0000 mg | Freq: Once | INTRAMUSCULAR | Status: AC
  Administered 2015-01-12: 10 mg

## 2015-01-12 NOTE — Progress Notes (Signed)
   Subjective:    Patient ID: Katherine IvoryDonna Mastro, female    DOB: Dec 17, 1965, 49 y.o.   MRN: [id number]  HPI Pt presents with left heel pain on lateral side of heel lasting several days now, worsens with prolonged standing, tried icing and nsaids. She states that the heel pain occur in a mild manner 16 months ago, after taking levaquin, but has worsened recently   Review of Systems  Endocrine: Positive for heat intolerance.  Genitourinary: Positive for frequency.  Musculoskeletal: Positive for back pain and arthralgias.  Allergic/Immunologic: Positive for food allergies.  All other systems reviewed and are negative.      Objective:   Physical Exam        Assessment & Plan:

## 2015-01-12 NOTE — Patient Instructions (Signed)

## 2015-01-13 NOTE — Progress Notes (Signed)
Subjective:     Patient ID: [name], female   DOB: [birth year], 49 y.o.   MRN: [id number]  HPI patient states she's had long-standing problems with plantar fasciitis that has worsened recently. States it's bad when she tries to walk or after sitting   Review of Systems  All other systems reviewed and are negative.      Objective:   Physical Exam  Constitutional: She is oriented to person, place, and time.  Cardiovascular: Intact distal pulses.   Musculoskeletal: Normal range of motion.  Neurological: She is oriented to person, place, and time.  Skin: Skin is warm.  Nursing note and vitals reviewed.  neurovascular status intact with muscle strength adequate range of motion within normal limits with patient noted to have discomfort in the plantar aspect left heel both medial and lateral side with fluid buildup. Has old orthotics that may have broken down and may need new orthotics which we discussed and is found to have good digital perfusion is well oriented 3 with moderate depression of the arch     Assessment:     Acute plantar fasciitis left medial side with probable compensatory symptoms lateral    Plan:     H&P and x-rays reviewed. Today I injected the plantar fascia left 3 mg Kenalog 5 mg Xylocaine and applied fascial brace with instructions on usage. Placed on oral anti-inflammatory gave instructions on physical therapy and reappoint to recheck

## 2015-02-10 ENCOUNTER — Encounter: Payer: Self-pay | Admitting: Podiatry

## 2015-02-10 ENCOUNTER — Ambulatory Visit (INDEPENDENT_AMBULATORY_CARE_PROVIDER_SITE_OTHER): Payer: BLUE CROSS/BLUE SHIELD | Admitting: Podiatry

## 2015-02-10 VITALS — BP 115/71 | HR 89 | Resp 16

## 2015-02-10 DIAGNOSIS — M722 Plantar fascial fibromatosis: Secondary | ICD-10-CM | POA: Diagnosis not present

## 2015-02-10 NOTE — Progress Notes (Signed)
Subjective:     Patient ID: [name], female   DOB: October 07, 1965, 49 y.o.   MRN: [id number]  HPI patient states my foot feels quite a bit better but I still do get some pain and mild orthotics have worn out and I know I need new orthotics because of flatfeet   Review of Systems     Objective:   Physical Exam Neurovascular status is intact with continued discomfort in the plantar heel and arch left with depression of the arch noted upon weightbearing    Assessment:     Plantar fasciitis still present left with inflammation    Plan:     Due to structural deformity of feet I did go ahead and casted her and scanned for a Berkley type orthotic device and then went ahead and discussed continued physical therapy and supportive shoe gear usage

## 2015-03-09 ENCOUNTER — Ambulatory Visit: Payer: BLUE CROSS/BLUE SHIELD | Admitting: *Deleted

## 2015-03-09 DIAGNOSIS — M722 Plantar fascial fibromatosis: Secondary | ICD-10-CM

## 2015-03-09 NOTE — Progress Notes (Signed)
Patient ID: [name], female   DOB: Dec 11, 1965, 49 y.o.   MRN: [id number] Patient presents for orthotic pick up.  Verbal and written break in and wear instructions given.  Patient will follow up in 4 weeks if symptoms worsen or fail to improve.

## 2015-03-09 NOTE — Patient Instructions (Signed)

## 2015-10-15 ENCOUNTER — Inpatient Hospital Stay (HOSPITAL_COMMUNITY)
Admission: EM | Admit: 2015-10-15 | Discharge: 2015-10-26 | DRG: 438 | Disposition: A | Discharge status: Home / Self Care | Payer: BLUE CROSS/BLUE SHIELD | Attending: Internal Medicine | Admitting: Internal Medicine

## 2015-10-15 ENCOUNTER — Encounter (HOSPITAL_COMMUNITY): Payer: Self-pay

## 2015-10-15 ENCOUNTER — Emergency Department (HOSPITAL_COMMUNITY): Payer: BLUE CROSS/BLUE SHIELD

## 2015-10-15 ENCOUNTER — Emergency Department (HOSPITAL_COMMUNITY)
Admission: EM | Admit: 2015-10-15 | Discharge: 2015-10-15 | Disposition: A | Discharge status: Home / Self Care | Payer: BLUE CROSS/BLUE SHIELD | Source: Home / Self Care | Attending: Emergency Medicine | Admitting: Emergency Medicine

## 2015-10-15 DIAGNOSIS — Z6832 Body mass index (BMI) 32.0-32.9, adult: Secondary | ICD-10-CM | POA: Diagnosis not present

## 2015-10-15 DIAGNOSIS — K859 Acute pancreatitis without necrosis or infection, unspecified: Principal | ICD-10-CM | POA: Diagnosis present

## 2015-10-15 DIAGNOSIS — Z8709 Personal history of other diseases of the respiratory system: Secondary | ICD-10-CM

## 2015-10-15 DIAGNOSIS — K219 Gastro-esophageal reflux disease without esophagitis: Secondary | ICD-10-CM

## 2015-10-15 DIAGNOSIS — G9340 Encephalopathy, unspecified: Secondary | ICD-10-CM | POA: Diagnosis not present

## 2015-10-15 DIAGNOSIS — E876 Hypokalemia: Secondary | ICD-10-CM | POA: Diagnosis present

## 2015-10-15 DIAGNOSIS — Z79899 Other long term (current) drug therapy: Secondary | ICD-10-CM | POA: Insufficient documentation

## 2015-10-15 DIAGNOSIS — E1310 Other specified diabetes mellitus with ketoacidosis without coma: Secondary | ICD-10-CM | POA: Diagnosis present

## 2015-10-15 DIAGNOSIS — R197 Diarrhea, unspecified: Secondary | ICD-10-CM

## 2015-10-15 DIAGNOSIS — K858 Other acute pancreatitis without necrosis or infection: Secondary | ICD-10-CM | POA: Insufficient documentation

## 2015-10-15 DIAGNOSIS — G4730 Sleep apnea, unspecified: Secondary | ICD-10-CM | POA: Diagnosis present

## 2015-10-15 DIAGNOSIS — E119 Type 2 diabetes mellitus without complications: Secondary | ICD-10-CM | POA: Insufficient documentation

## 2015-10-15 DIAGNOSIS — Z888 Allergy status to other drugs, medicaments and biological substances status: Secondary | ICD-10-CM

## 2015-10-15 DIAGNOSIS — R109 Unspecified abdominal pain: Secondary | ICD-10-CM

## 2015-10-15 DIAGNOSIS — Z881 Allergy status to other antibiotic agents status: Secondary | ICD-10-CM

## 2015-10-15 DIAGNOSIS — R52 Pain, unspecified: Secondary | ICD-10-CM

## 2015-10-15 DIAGNOSIS — Z8669 Personal history of other diseases of the nervous system and sense organs: Secondary | ICD-10-CM | POA: Insufficient documentation

## 2015-10-15 DIAGNOSIS — E871 Hypo-osmolality and hyponatremia: Secondary | ICD-10-CM | POA: Diagnosis present

## 2015-10-15 DIAGNOSIS — Z7984 Long term (current) use of oral hypoglycemic drugs: Secondary | ICD-10-CM | POA: Diagnosis not present

## 2015-10-15 DIAGNOSIS — F40240 Claustrophobia: Secondary | ICD-10-CM | POA: Diagnosis present

## 2015-10-15 DIAGNOSIS — Z7951 Long term (current) use of inhaled steroids: Secondary | ICD-10-CM

## 2015-10-15 DIAGNOSIS — E781 Pure hyperglyceridemia: Secondary | ICD-10-CM | POA: Diagnosis present

## 2015-10-15 DIAGNOSIS — E1110 Type 2 diabetes mellitus with ketoacidosis without coma: Secondary | ICD-10-CM | POA: Diagnosis present

## 2015-10-15 DIAGNOSIS — Z792 Long term (current) use of antibiotics: Secondary | ICD-10-CM

## 2015-10-15 DIAGNOSIS — R112 Nausea with vomiting, unspecified: Secondary | ICD-10-CM

## 2015-10-15 DIAGNOSIS — Z882 Allergy status to sulfonamides status: Secondary | ICD-10-CM

## 2015-10-15 DIAGNOSIS — E860 Dehydration: Secondary | ICD-10-CM | POA: Diagnosis present

## 2015-10-15 DIAGNOSIS — R4182 Altered mental status, unspecified: Secondary | ICD-10-CM | POA: Diagnosis not present

## 2015-10-15 DIAGNOSIS — E1165 Type 2 diabetes mellitus with hyperglycemia: Secondary | ICD-10-CM | POA: Diagnosis not present

## 2015-10-15 LAB — CBC WITH DIFFERENTIAL/PLATELET
BASOS ABS: 0 10*3/uL (ref 0.0–0.1)
BASOS PCT: 0 %
BASOS PCT: 0 %
Basophils Absolute: 0 10*3/uL (ref 0.0–0.1)
EOS ABS: 0 10*3/uL (ref 0.0–0.7)
EOS PCT: 1 %
EOS PCT: 0 %
Eosinophils Absolute: 0.1 10*3/uL (ref 0.0–0.7)
HCT: 31 % — ABNORMAL LOW (ref 36.0–46.0)
HCT: 38.1 % (ref 36.0–46.0)
HEMOGLOBIN: 13.1 g/dL (ref 12.0–15.0)
Hemoglobin: 10.8 g/dL — ABNORMAL LOW (ref 12.0–15.0)
LYMPHS PCT: 9 %
Lymphocytes Relative: 18 %
Lymphs Abs: 1.2 10*3/uL (ref 0.7–4.0)
Lymphs Abs: 0.9 10*3/uL (ref 0.7–4.0)
MCH: 31.1 pg (ref 26.0–34.0)
MCH: 30.2 pg (ref 26.0–34.0)
MCHC: 34.8 g/dL (ref 30.0–36.0)
MCHC: 34.4 g/dL (ref 30.0–36.0)
MCV: 89.3 fL (ref 78.0–100.0)
MCV: 87.8 fL (ref 78.0–100.0)
MONO ABS: 0.3 10*3/uL (ref 0.1–1.0)
Monocytes Absolute: 0.6 10*3/uL (ref 0.1–1.0)
Monocytes Relative: 5 %
Monocytes Relative: 6 %
NEUTROS ABS: 8.8 10*3/uL — AB (ref 1.7–7.7)
Neutro Abs: 5.2 10*3/uL (ref 1.7–7.7)
Neutrophils Relative %: 76 %
Neutrophils Relative %: 85 %
PLATELETS: 238 10*3/uL (ref 150–400)
Platelets: 308 10*3/uL (ref 150–400)
RBC: 3.47 MIL/uL — ABNORMAL LOW (ref 3.87–5.11)
RBC: 4.34 MIL/uL (ref 3.87–5.11)
RDW: 14 % (ref 11.5–15.5)
RDW: 13.8 % (ref 11.5–15.5)
WBC: 6.8 10*3/uL (ref 4.0–10.5)
WBC: 10.3 10*3/uL (ref 4.0–10.5)

## 2015-10-15 LAB — LIPASE, BLOOD
LIPASE: 942 U/L — AB (ref 11–51)
Lipase: 204 U/L — ABNORMAL HIGH (ref 11–51)

## 2015-10-15 LAB — URINALYSIS, ROUTINE W REFLEX MICROSCOPIC
Glucose, UA: >1000 mg/dL — AB
HGB URINE DIPSTICK: NEGATIVE
KETONES UR: 40 mg/dL — AB
Leukocytes, UA: NEGATIVE
Nitrite: NEGATIVE
PROTEIN: 30 mg/dL — AB
Specific Gravity, Urine: 1.031 — ABNORMAL HIGH (ref 1.005–1.030)
pH: 5.5 (ref 5.0–8.0)

## 2015-10-15 LAB — COMPREHENSIVE METABOLIC PANEL
ALK PHOS: 63 U/L (ref 38–126)
ALT: 19 U/L (ref 14–54)
ANION GAP: 21 — AB (ref 5–15)
AST: 20 U/L (ref 15–41)
Albumin: 3.1 g/dL — ABNORMAL LOW (ref 3.5–5.0)
BUN: 7 mg/dL (ref 6–20)
CALCIUM: 8.3 mg/dL — AB (ref 8.9–10.3)
CO2: 11 mmol/L — AB (ref 22–32)
CREATININE: 0.89 mg/dL (ref 0.44–1.00)
Chloride: 99 mmol/L — ABNORMAL LOW (ref 101–111)
Glucose, Bld: 263 mg/dL — ABNORMAL HIGH (ref 65–99)
Potassium: 4.1 mmol/L (ref 3.5–5.1)
SODIUM: 131 mmol/L — AB (ref 135–145)
TOTAL PROTEIN: 6.5 g/dL (ref 6.5–8.1)
Total Bilirubin: 1.8 mg/dL — ABNORMAL HIGH (ref 0.3–1.2)

## 2015-10-15 LAB — URINE MICROSCOPIC-ADD ON: RBC / HPF: NONE SEEN RBC/hpf (ref 0–5)

## 2015-10-15 LAB — I-STAT CHEM 8, ED
BUN: 10 mg/dL (ref 6–20)
Calcium, Ion: 1.17 mmol/L (ref 1.12–1.23)
Chloride: 105 mmol/L (ref 101–111)
Creatinine, Ser: 0.5 mg/dL (ref 0.44–1.00)
GLUCOSE: 270 mg/dL — AB (ref 65–99)
HEMATOCRIT: 44 % (ref 36.0–46.0)
HEMOGLOBIN: 15 g/dL (ref 12.0–15.0)
POTASSIUM: 4.3 mmol/L (ref 3.5–5.1)
Sodium: 130 mmol/L — ABNORMAL LOW (ref 135–145)
TCO2: 19 mmol/L (ref 0–100)

## 2015-10-15 LAB — I-STAT ARTERIAL BLOOD GAS, ED
ACID-BASE DEFICIT: 13 mmol/L — AB (ref 0.0–2.0)
Bicarbonate: 11.4 mEq/L — ABNORMAL LOW (ref 20.0–24.0)
O2 SAT: 94 %
PH ART: 7.283 — AB (ref 7.350–7.450)
PO2 ART: 80 mmHg (ref 80.0–100.0)
TCO2: 12 mmol/L (ref 0–100)
pCO2 arterial: 24 mmHg — ABNORMAL LOW (ref 35.0–45.0)

## 2015-10-15 LAB — CBG MONITORING, ED: Glucose-Capillary: 224 mg/dL — ABNORMAL HIGH (ref 65–99)

## 2015-10-15 LAB — BASIC METABOLIC PANEL
Anion gap: 18 — ABNORMAL HIGH (ref 5–15)
BUN: 6 mg/dL (ref 6–20)
CALCIUM: 7.9 mg/dL — AB (ref 8.9–10.3)
CHLORIDE: 105 mmol/L (ref 101–111)
CO2: 11 mmol/L — ABNORMAL LOW (ref 22–32)
CREATININE: 0.7 mg/dL (ref 0.44–1.00)
GFR calc non Af Amer: >60 mL/min (ref >60)
Glucose, Bld: 250 mg/dL — ABNORMAL HIGH (ref 65–99)
Potassium: 4 mmol/L (ref 3.5–5.1)
SODIUM: 134 mmol/L — AB (ref 135–145)

## 2015-10-15 MED ORDER — HYDROMORPHONE HCL 1 MG/ML IJ SOLN
1.0000 mg | Freq: Once | INTRAMUSCULAR | Status: AC
  Administered 2015-10-15: 1 mg via INTRAVENOUS
  Filled 2015-10-15: qty 1

## 2015-10-15 MED ORDER — LORAZEPAM 2 MG/ML IJ SOLN
2.0000 mg | Freq: Once | INTRAMUSCULAR | Status: AC
  Administered 2015-10-16: 2 mg via INTRAVENOUS
  Filled 2015-10-15: qty 1

## 2015-10-15 MED ORDER — ONDANSETRON HCL 4 MG PO TABS: 4.0000 mg | ORAL_TABLET | Freq: Four times a day (QID) | ORAL | Status: DC | PRN

## 2015-10-15 MED ORDER — SODIUM CHLORIDE 0.9 % IV BOLUS (SEPSIS)
1000.0000 mL | Freq: Once | INTRAVENOUS | Status: AC
  Administered 2015-10-15: 1000 mL via INTRAVENOUS

## 2015-10-15 MED ORDER — ONDANSETRON HCL 4 MG/2ML IJ SOLN
4.0000 mg | Freq: Once | INTRAMUSCULAR | Status: AC
  Administered 2015-10-15: 4 mg via INTRAVENOUS
  Filled 2015-10-15: qty 2

## 2015-10-15 MED ORDER — DEXTROSE-NACL 5-0.45 % IV SOLN
INTRAVENOUS | Status: DC
  Administered 2015-10-15 – 2015-10-17 (×4): via INTRAVENOUS

## 2015-10-15 MED ORDER — SODIUM CHLORIDE 0.9 % IV SOLN
INTRAVENOUS | Status: DC
  Administered 2015-10-21 – 2015-10-24 (×4): via INTRAVENOUS

## 2015-10-15 MED ORDER — SODIUM CHLORIDE 0.9 % IV SOLN
Freq: Once | INTRAVENOUS | Status: AC
  Administered 2015-10-16: 04:00:00 via INTRAVENOUS

## 2015-10-15 MED ORDER — SODIUM CHLORIDE 0.9 % IV SOLN
INTRAVENOUS | Status: DC
  Administered 2015-10-15: 1.6 [IU]/h via INTRAVENOUS
  Administered 2015-10-17: 6.5 [IU]/h via INTRAVENOUS
  Administered 2015-10-17: 3 [IU]/h via INTRAVENOUS
  Administered 2015-10-17: 7.5 [IU]/h via INTRAVENOUS
  Administered 2015-10-17: 11.2 [IU]/h via INTRAVENOUS
  Administered 2015-10-18: 2.6 [IU]/h via INTRAVENOUS
  Administered 2015-10-18: 1.4 [IU]/h via INTRAVENOUS
  Administered 2015-10-18: 2.4 [IU]/h via INTRAVENOUS
  Administered 2015-10-18: 2.1 [IU]/h via INTRAVENOUS
  Administered 2015-10-18: 2.2 [IU]/h via INTRAVENOUS
  Administered 2015-10-18: 1.6 [IU]/h via INTRAVENOUS
  Administered 2015-10-18: 1.7 [IU]/h via INTRAVENOUS
  Administered 2015-10-18: 2.2 [IU]/h via INTRAVENOUS
  Administered 2015-10-19 (×2): 3.4 [IU]/h via INTRAVENOUS
  Administered 2015-10-19: 2.9 [IU]/h via INTRAVENOUS
  Administered 2015-10-20: 2.7 [IU]/h via INTRAVENOUS
  Filled 2015-10-15 (×5): qty 2.5

## 2015-10-15 MED ORDER — HYDROMORPHONE HCL 1 MG/ML IJ SOLN
0.5000 mg | INTRAMUSCULAR | Status: DC | PRN
  Administered 2015-10-15: 0.5 mg via INTRAVENOUS
  Filled 2015-10-15: qty 1

## 2015-10-15 MED ORDER — DICYCLOMINE HCL 10 MG/ML IM SOLN
20.0000 mg | Freq: Once | INTRAMUSCULAR | Status: AC
  Administered 2015-10-15: 20 mg via INTRAMUSCULAR
  Filled 2015-10-15: qty 2

## 2015-10-15 MED ORDER — OXYCODONE-ACETAMINOPHEN 5-325 MG PO TABS: 1.0000 | ORAL_TABLET | Freq: Four times a day (QID) | ORAL | Status: DC | PRN

## 2015-10-15 MED ORDER — ONDANSETRON HCL 4 MG/2ML IJ SOLN
4.0000 mg | Freq: Four times a day (QID) | INTRAMUSCULAR | Status: DC | PRN
  Administered 2015-10-15 – 2015-10-20 (×12): 4 mg via INTRAVENOUS
  Filled 2015-10-15 (×12): qty 2

## 2015-10-15 MED ORDER — KETOROLAC TROMETHAMINE 30 MG/ML IJ SOLN
30.0000 mg | Freq: Once | INTRAMUSCULAR | Status: AC
  Administered 2015-10-15: 30 mg via INTRAVENOUS
  Filled 2015-10-15: qty 1

## 2015-10-15 MED ORDER — ONDANSETRON 8 MG PO TBDP: ORAL_TABLET | ORAL | Status: AC

## 2015-10-15 MED ORDER — POTASSIUM CHLORIDE 10 MEQ/100ML IV SOLN: 10.0000 meq | INTRAVENOUS | Status: DC

## 2015-10-15 MED ORDER — SODIUM CHLORIDE 0.9 % IV BOLUS (SEPSIS)
1000.0000 mL | Freq: Once | INTRAVENOUS | Status: AC
  Administered 2015-10-16: 1000 mL via INTRAVENOUS

## 2015-10-15 MED ORDER — OXYCODONE-ACETAMINOPHEN 5-325 MG PO TABS
2.0000 | ORAL_TABLET | Freq: Once | ORAL | Status: AC
  Administered 2015-10-15: 2 via ORAL
  Filled 2015-10-15: qty 2

## 2015-10-15 NOTE — Discharge Instructions (Signed)

## 2015-10-15 NOTE — ED Notes (Signed)
Pt here with c/o LLQ abdominal pain which radiates to right side of abdomen. Pt reporting she was seen here earlier today and diagnosed with pancreatitis but did not have CT scan done because of her severe claustrophobia. She reports nausea and emesis but none since she was discharged. She is back here today because the pain is worse.

## 2015-10-15 NOTE — Progress Notes (Signed)
Pt came to CT for a CT Abdomen/Pelvis w/o cm. Pt admittedly is very claustrophobic. Pt. And myself  tried for 10-15 minutes to calm pt. for scan. Pt was unable to tolerate even being moved close to the scanner. Pt returned to ED, reported event to Dr Blenda PealsPalumbo,Millie, RN

## 2015-10-15 NOTE — ED Notes (Signed)
Pt tolerating po fluids

## 2015-10-15 NOTE — ED Provider Notes (Addendum)
CSN: [id number]     Arrival date & time 10/15/15  0357 History   First MD Initiated Contact with Patient 10/15/15 0409     Chief Complaint  Patient presents with  . Abdominal Pain     (Consider location/radiation/quality/duration/timing/severity/associated sxs/prior Treatment) Patient is a 50 y.o. female presenting with abdominal pain. The history is provided by the patient.  Abdominal Pain Pain location:  Generalized Pain quality: cramping   Pain radiates to:  Does not radiate Pain severity:  Moderate Onset quality:  Gradual Timing:  Constant Progression:  Unchanged Chronicity:  New Context: retching   Relieved by:  Nothing Worsened by:  Nothing tried Ineffective treatments:  None tried Associated symptoms: diarrhea and vomiting   Associated symptoms: no chest pain, no fever and no shortness of breath   Risk factors: no recent hospitalization     Past Medical History  Diagnosis Date  . Pancreatitis   . Diabetes mellitus   . Bronchitis   . GERD (gastroesophageal reflux disease)   . Sleep apnea   . Acute pancreatitis 09/30/2010  . Ulcer   . Family history of malignant neoplasm of breast    Past Surgical History  Procedure Laterality Date  . Cesarean section    . Bunionectomy    . Wisdom tooth extraction  1986   Family History  Problem Relation Age of Onset  . Breast cancer Mother 4557    currently 8676; TAH/BSO ~38yo; only child  . Heart disease Father     SVT  . Diabetes Father   . Colon polyps Father   . Colon cancer Paternal Grandfather     dx 2160s; deceased 1360s  . Diabetes Sister   . Heart disease Sister   . Allergies Son   . Allergies Sister    Social History  Substance Use Topics  . Smoking status: Never Smoker   . Smokeless tobacco: Never Used  . Alcohol Use: No   OB History    No data available     Review of Systems  Constitutional: Negative for fever.  Respiratory: Negative for shortness of breath.   Cardiovascular: Negative for chest pain.   Gastrointestinal: Positive for vomiting, abdominal pain and diarrhea.  All other systems reviewed and are negative.     Allergies  Erythromycin; Other; and Sulfa drugs cross reactors  Home Medications   Prior to Admission medications   Medication Sig Start Date End Date Taking? Authorizing Provider  albuterol (PROVENTIL HFA;VENTOLIN HFA) 108 (90 BASE) MCG/ACT inhaler Inhale 2 puffs into the lungs every 4 (four) hours as needed (cough, shortness of breath or wheezing.). 07/03/13   Chelle Jeffery, PA-C  amoxicillin-clavulanate (AUGMENTIN) 875-125 MG per tablet Take 1 tablet by mouth 2 (two) times daily. 08/02/13   Peyton Najjaravid H Hopper, MD  Bioflavonoid Products (ESTER-C) 1000-50 MG TABS Take by mouth 2 (two) times daily.      Historical Provider, MD  calcium citrate-vitamin D (CITRACAL+D) 315-200 MG-UNIT per tablet Take 1 tablet by mouth 2 (two) times daily.      Historical Provider, MD  chlorpheniramine-HYDROcodone (TUSSIONEX PENNKINETIC ER) 10-8 MG/5ML LQCR Take 5 mLs by mouth every 12 (twelve) hours as needed (cough). 07/03/13   Chelle Jeffery, PA-C  Cod Liver Oil 5000-500 UNIT/5ML OIL Take by mouth. Take 1 tablespoon every morning     Historical Provider, MD  Digestive Enzymes (MULTI-ENZYME) TABS Take by mouth. Take 1 twice daily     Historical Provider, MD  doxycycline (VIBRAMYCIN) 100 MG capsule Take 1 capsule (100  mg total) by mouth 2 (two) times daily. 08/06/13   Peyton Najjar, MD  fenofibrate 160 MG tablet Take 160 mg by mouth daily.      Historical Provider, MD  fluticasone (FLONASE) 50 MCG/ACT nasal spray 2 sprays by Nasal route daily.      Historical Provider, MD  metFORMIN (GLUCOPHAGE) 850 MG tablet Take 850 mg by mouth 2 (two) times daily with a meal.      Historical Provider, MD  multivitamin (THERAGRAN) per tablet Take 1 tablet by mouth daily.      Historical Provider, MD  mupirocin ointment (BACTROBAN) 2 % Apply 1 application topically 3 (three) times daily. 08/06/13   Peyton Najjar, MD   Norethin Ace-Eth Estrad-FE (LOESTRIN FE 1/20 PO) Take by mouth as directed.      Historical Provider, MD  OVER THE COUNTER MEDICATION amla-c [place] groseberry    Historical Provider, MD  OVER THE COUNTER MEDICATION relshi mushroom    Historical Provider, MD  pantoprazole (PROTONIX) 40 MG tablet Take 40 mg by mouth daily.      Historical Provider, MD  simvastatin (ZOCOR) 40 MG tablet Take 40 mg by mouth at bedtime.      Historical Provider, MD  traZODone (DESYREL) 100 MG tablet Take 100 mg by mouth at bedtime.      Historical Provider, MD   BP 145/77 mmHg  Pulse 103  Temp(Src) 97.9 F (36.6 C) (Oral)  Resp 20  SpO2 99%  LMP 09/22/2015 (Approximate) Physical Exam  Constitutional: She is oriented to person, place, and time. She appears well-developed and well-nourished. No distress.  HENT:  Head: Normocephalic and atraumatic.  Mouth/Throat: Oropharynx is clear and moist.  Eyes: Conjunctivae are normal. Pupils are equal, round, and reactive to light.  Neck: Normal range of motion. Neck supple.  Cardiovascular: Normal rate, regular rhythm and intact distal pulses.   Pulmonary/Chest: Effort normal. No respiratory distress. She has no wheezes. She has no rales.  Abdominal: Soft. Bowel sounds are increased. There is no tenderness. There is no rigidity, no rebound, no guarding, no tenderness at McBurney's point and negative Murphy's sign.  Musculoskeletal: Normal range of motion.  Neurological: She is alert and oriented to person, place, and time.  Skin: Skin is warm and dry.  Psychiatric: She has a normal mood and affect.    ED Course  Procedures (including critical care time) Labs Review Labs Reviewed  CBC WITH DIFFERENTIAL/PLATELET  URINALYSIS, ROUTINE W REFLEX MICROSCOPIC (NOT AT [facility])  I-STAT CHEM 8, ED    Imaging Review No results found. I have personally reviewed and evaluated these images and lab results as part of my medical decision-making.   EKG Interpretation None       MDM   Final diagnoses:  None   Results for orders placed or performed during the hospital encounter of 10/15/15  CBC with Differential/Platelet  Result Value Ref Range   WBC 6.8 4.0 - 10.5 K/uL   RBC 3.47 (L) 3.87 - 5.11 MIL/uL   Hemoglobin 10.8 (L) 12.0 - 15.0 g/dL   HCT 16.1 (L) 09.6 - 04.5 %   MCV 89.3 78.0 - 100.0 fL   MCH 31.1 26.0 - 34.0 pg   MCHC 34.8 30.0 - 36.0 g/dL   RDW 40.9 81.1 - 91.4 %   Platelets 238 150 - 400 K/uL   Neutrophils Relative % 76 %   Neutro Abs 5.2 1.7 - 7.7 K/uL   Lymphocytes Relative 18 %   Lymphs Abs 1.2  0.7 - 4.0 K/uL   Monocytes Relative 5 %   Monocytes Absolute 0.3 0.1 - 1.0 K/uL   Eosinophils Relative 1 %   Eosinophils Absolute 0.1 0.0 - 0.7 K/uL   Basophils Relative 0 %   Basophils Absolute 0.0 0.0 - 0.1 K/uL  I-Stat Chem 8, ED  Result Value Ref Range   Sodium 130 (L) 135 - 145 mmol/L   Potassium 4.3 3.5 - 5.1 mmol/L   Chloride 105 101 - 111 mmol/L   BUN 10 6 - 20 mg/dL   Creatinine, Ser 2.44 0.44 - 1.00 mg/dL   Glucose, Bld 010 (H) 65 - 99 mg/dL   Calcium, Ion 2.72 5.36 - 1.23 mmol/L   TCO2 19 0 - 100 mmol/L   Hemoglobin 15.0 12.0 - 15.0 g/dL   HCT 64.4 03.4 - 74.2 %   No results found.  Filed Vitals:   10/15/15 0430 10/15/15 0500  BP: 119/82 115/74  Pulse: 93 98  Temp:    Resp:     Medications  oxyCODONE-acetaminophen (PERCOCET/ROXICET) 5-325 MG per tablet 2 tablet (not administered)  sodium chloride 0.9 % bolus 1,000 mL (0 mLs Intravenous Stopped 10/15/15 0555)  ondansetron (ZOFRAN) injection 4 mg (4 mg Intravenous Given 10/15/15 0501)  dicyclomine (BENTYL) injection 20 mg (20 mg Intramuscular Given 10/15/15 0501)  ketorolac (TORADOL) 30 MG/ML injection 30 mg (30 mg Intravenous Given 10/15/15 0601)   Results for orders placed or performed during the hospital encounter of 10/15/15  CBC with Differential/Platelet  Result Value Ref Range   WBC 6.8 4.0 - 10.5 K/uL   RBC 3.47 (L) 3.87 - 5.11 MIL/uL   Hemoglobin 10.8 (L)  12.0 - 15.0 g/dL   HCT 59.5 (L) 63.8 - 75.6 %   MCV 89.3 78.0 - 100.0 fL   MCH 31.1 26.0 - 34.0 pg   MCHC 34.8 30.0 - 36.0 g/dL   RDW 43.3 29.5 - 18.8 %   Platelets 238 150 - 400 K/uL   Neutrophils Relative % 76 %   Neutro Abs 5.2 1.7 - 7.7 K/uL   Lymphocytes Relative 18 %   Lymphs Abs 1.2 0.7 - 4.0 K/uL   Monocytes Relative 5 %   Monocytes Absolute 0.3 0.1 - 1.0 K/uL   Eosinophils Relative 1 %   Eosinophils Absolute 0.1 0.0 - 0.7 K/uL   Basophils Relative 0 %   Basophils Absolute 0.0 0.0 - 0.1 K/uL  Urinalysis, Routine w reflex microscopic (not at [facility])  Result Value Ref Range   Color, Urine YELLOW YELLOW   APPearance CLEAR CLEAR   Specific Gravity, Urine 1.031 (H) 1.005 - 1.030   pH 5.5 5.0 - 8.0   Glucose, UA >1000 (A) NEGATIVE mg/dL   Hgb urine dipstick NEGATIVE NEGATIVE   Bilirubin Urine SMALL (A) NEGATIVE   Ketones, ur 40 (A) NEGATIVE mg/dL   Protein, ur 30 (A) NEGATIVE mg/dL   Nitrite NEGATIVE NEGATIVE   Leukocytes, UA NEGATIVE NEGATIVE  Urine microscopic-add on  Result Value Ref Range   Squamous Epithelial / LPF 0-5 (A) NONE SEEN   WBC, UA 0-5 0 - 5 WBC/hpf   RBC / HPF NONE SEEN 0 - 5 RBC/hpf   Bacteria, UA RARE (A) NONE SEEN  Lipase, blood  Result Value Ref Range   Lipase 204 (H) 11 - 51 U/L  I-Stat Chem 8, ED  Result Value Ref Range   Sodium 130 (L) 135 - 145 mmol/L   Potassium 4.3 3.5 - 5.1 mmol/L   Chloride  105 101 - 111 mmol/L   BUN 10 6 - 20 mg/dL   Creatinine, Ser 1.61 0.44 - 1.00 mg/dL   Glucose, Bld 096 (H) 65 - 99 mg/dL   Calcium, Ion 0.45 4.09 - 1.23 mmol/L   TCO2 19 0 - 100 mmol/L   Hemoglobin 15.0 12.0 - 15.0 g/dL   HCT 81.1 91.4 - 78.2 %   No results found.    Exam and vitals and labs are benign and reassuring.  Diarrhea started first and then cramping and emesis.  Exam is benign and patient is able to PO challenge will start percocet and zofran and have patient follow up with her PMD and GI doctor for ongoing management of her  pancreatitis.  Patient verbalizes understanding and agrees to follow up   Jahni Paul, MD 10/15/15 9562  Cy Blamer, MD 10/15/15 [phone]

## 2015-10-15 NOTE — ED Provider Notes (Signed)
Unable to complete CT due to claustrophobia.      Katherine BlamerApril Shaya Altamura, MD 10/15/15 [id number]

## 2015-10-15 NOTE — ED Notes (Signed)
Per EMS pt had sudden onset of lower left quadrant abdominal pain that started lastnight that has gotten increasingly worse; Pt c/o n/v at 1am; Pt is carrying a zip lock back with some form of substance that she claim to have thrown up; Pt c/o at 4/10 throbbing with occasional sharp shooting pain; Pt a&ox 4 on arrival; Pt ambulated to the room;

## 2015-10-15 NOTE — H&P (Signed)
[facility] History and Physical  Katherine IvoryDonna Caponi ZOX:[id number] DOB: [birth year] DOA: 10/15/2015  PCP: Hoyle SauerAVVA,RAVISANKAR R, MD   Chief Complaint: Abdominal pain  HPI: [name] is a 50 y.o. woman with a history of Type 2 DM (level of control unknown; treated with metformin), GERD, and recurrent pancreatitis (2010, 2012, 2014; etiology unclear) who presented earlier today for evaluation of abdominal pain.  She had a mild elevation of her lipase at that time (204), but she seemed to improved with anti-emetics and analgesics and was discharged to home with plans to follow-up with outpatient providers.  Unfortunately, the patient had an escalation in her pain symptoms after passing her oral challenge in the ED.  She describes a throbbing left-sided pain that radiates to her epigastric area, 7-8 out of 10 in intensity at its worst.  She has had three episodes of diarrhea.  She has had nausea but no vomiting.  No fever or chills but she has had sweats. No chest pain, SOB, or syncope.  She returned to the ED tonight and was found to have a lipase level that has increased to 942.  She does not have abdominal imaging yet because she has severe claustrophobia and initial CT request was cancelled.  Of note, she also has a bicarb level of 11 and an anion gap of 21 on her last BMP, even though her blood sugar is only elevated to the 260's.  She has ketones on her U/A.  Hospitalist asked to admit for pancreatitis, but I believe she also has mild DKA, likely triggered by her acute illness.  Repeat ABG and BMP are pending; the patient is being admitted to the stepdown unit for further evaluation and treatment.  Review of Systems: 12 systems reviewed and negative except as stated in the HPI.  Past Medical History  Diagnosis Date  . Pancreatitis   . Diabetes mellitus   . Bronchitis   . GERD (gastroesophageal reflux disease)   . Sleep apnea   . Acute pancreatitis 09/30/2010  . Ulcer   . Family history of  malignant neoplasm of breast    Past Surgical History  Procedure Laterality Date  . Cesarean section    . Bunionectomy    . Wisdom tooth extraction  1986   Social History:  Social History   Social History Narrative   One biological son, 3 step children  No tobacco, EtOH, or illicit drug use.  She is married.  Allergies  Allergen Reactions  . Erythromycin Other (See Comments)    tachycardia  . Other Other (See Comments)    Oral steroids caused pancreatitis?  Gaetana Michaelis. Sulfa Drugs Cross Reactors Rash    Family History  Problem Relation Age of Onset  . Breast cancer Mother 6057    currently 6876; TAH/BSO ~38yo; only child  . Heart disease Father     SVT  . Diabetes Father   . Colon polyps Father   . Colon cancer Paternal Grandfather     dx 7060s; deceased 7060s  . Diabetes Sister   . Heart disease Sister   . Allergies Son   . Allergies Sister    Prior to Admission medications   Medication Sig Start Date End Date Taking? Authorizing Provider  calcium citrate-vitamin D (CITRACAL+D) 315-200 MG-UNIT per tablet Take 1 tablet by mouth 2 (two) times daily.     Yes Historical Provider, MD  cholecalciferol (VITAMIN D) 1000 units tablet Take 1,000 Units by mouth daily.   Yes Historical Provider, MD  Digestive Enzymes (  MULTI-ENZYME) TABS Take 1 tablet by mouth 3 (three) times daily.    Yes Historical Provider, MD  fenofibrate 160 MG tablet Take 160 mg by mouth every morning.    Yes Historical Provider, MD  fluticasone (FLONASE) 50 MCG/ACT nasal spray Place 2 sprays into the nose at bedtime.    Yes Historical Provider, MD  loratadine (CLARITIN) 10 MG tablet Take 10 mg by mouth every morning.   Yes Historical Provider, MD  Melatonin 1 MG CAPS Take 1 mg by mouth every evening.   Yes Historical Provider, MD  metFORMIN (GLUCOPHAGE-XR) 500 MG 24 hr tablet Take 500-1,000 mg by mouth 2 (two) times daily. Take 1 tablet with breakfast and lunch and 2 tablets with dinner   Yes Historical Provider, MD   Multiple Vitamin (MULTIVITAMIN WITH MINERALS) TABS tablet Take 2 tablets by mouth 2 (two) times daily.   Yes Historical Provider, MD  norethindrone-ethinyl estradiol (MICROGESTIN,JUNEL,LOESTRIN) 1-20 MG-MCG tablet Take 1 tablet by mouth daily.   Yes Historical Provider, MD  Omega-3 Fatty Acids (FISH OIL) 1200 MG CAPS Take 1,200 mg by mouth 2 (two) times daily.   Yes Historical Provider, MD  OVER THE COUNTER MEDICATION Take 1 tablet by mouth 2 (two) times daily. amla-c [place] groseberry   Yes Historical Provider, MD  OVER THE COUNTER MEDICATION Take 1 capsule by mouth every morning. relshi mushroom   Yes Historical Provider, MD  OVER THE COUNTER MEDICATION Take 10 mLs by mouth 2 (two) times daily. L-glutamne   Yes Historical Provider, MD  oxyCODONE-acetaminophen (PERCOCET) 5-325 MG tablet Take 1-2 tablets by mouth every 6 (six) hours as needed. Patient taking differently: Take 1-2 tablets by mouth every 6 (six) hours as needed for severe pain.  10/15/15  Yes April Palumbo, MD  Probiotic Product (PROBIOTIC PO) Take 1 tablet by mouth 2 (two) times daily.   Yes Historical Provider, MD  Quercetin 250 MG TABS Take 500 mg by mouth 2 (two) times daily.   Yes Historical Provider, MD  simvastatin (ZOCOR) 40 MG tablet Take 40 mg by mouth at bedtime.     Yes Historical Provider, MD  Turmeric 500 MG CAPS Take 1 tablet by mouth every morning.   Yes Historical Provider, MD  VALERIAN ROOT PO Take 1,020 mg by mouth every evening.   Yes Historical Provider, MD  ADVAIR DISKUS 250-50 MCG/DOSE AEPB Inhale 1 spray into the lungs 2 (two) times daily as needed (for SOB).  10/11/15   Historical Provider, MD  azithromycin (ZITHROMAX) 250 MG tablet Take 250 mg by mouth daily. Reported on 10/15/2015 10/10/15   Historical Provider, MD  ondansetron (ZOFRAN ODT) 8 MG disintegrating tablet  ODT q8 hours prn nausea Patient taking differently: Take 8 mg by mouth every 8 (eight) hours as needed for nausea or vomiting.  ODT q8 hours  prn nausea 10/15/15   April Palumbo, MD   Physical Exam: Filed Vitals:   10/15/15 2130 10/15/15 2145 10/15/15 2200 10/15/15 2215  BP: 116/73 133/93 131/84 130/79  Pulse: 97 104 96 102  Temp:      TempSrc:      Resp:      SpO2: 94% 95% 94% 96%     General:  Awake and alert.  Oriented to person, place, time and situation.  NAD but ill appearing.  Head: Krebs/AT  Eyes: PERRL bilaterally, conjunctiva are pink.  EOMI.  ENT: Mucous membranes are dry.  No nasal drainage.  Neck is supple.  Cardiovascular: NR/RR.  No LE edema.  Respiratory: CTA bilaterally.  GI: Abdomen is protuberant but compressible.  She has mild guarding with palpation to the LUQ and epigastric areas.  Bowel sounds are hypoactive.  Skin: Warm and dry.  Musculoskeletal: Moves all four extremities spontaneously.  Psychiatric: Normal affect.  Neurologic: No focal deficits.  Labs on Admission:  Basic Metabolic Panel:  Recent Labs Lab 10/15/15 0441 10/15/15 2034  NA 130* 131*  K 4.3 4.1  CL 105 99*  CO2  --  11*  GLUCOSE 270* 263*  BUN 10 7  CREATININE 0.50 0.89  CALCIUM  --  8.3*   Liver Function Tests:  Recent Labs Lab 10/15/15 2034  AST 20  ALT 19  ALKPHOS 63  BILITOT 1.8*  PROT 6.5  ALBUMIN 3.1*    Recent Labs Lab 10/15/15 0455 10/15/15 2034  LIPASE 204* 942*   CBC:  Recent Labs Lab 10/15/15 0433 10/15/15 0441 10/15/15 2034  WBC 6.8  --  10.3  NEUTROABS 5.2  --  8.8*  HGB 10.8* 15.0 13.1  HCT 31.0* 44.0 38.1  MCV 89.3  --  87.8  PLT 238  --  308   Assessment/Plan Principal Problem:   Acute pancreatitis Active Problems:   Diabetes mellitus, type 2 (HCC)   DKA (diabetic ketoacidoses) (HCC)  Admit to stepdown unit, telemetry  Acute pancreatitis --Patient is willing to try mild sedation with IV ativan so that we can attempt to get CT A/P again.  If this fails, will likely request ultrasound tonight and will need to consider imaging with sedation in the  morning. --Fasting lipid panel --Holding home medications (of note, she is on a large number of vitamins and supplements) --Bowel rest, NPO --Aggressive IV fluids resuscitation --Analgesics and anti-emetics as needed --Consider GI consult in the AM  Mild DKA, anion gap of 21 with bicarb of 11 and ketones in urine --Follow BMP --Follow-up ABG --Plan for IV insulin per protocol for now; she will likely need D5 added to her IV fluids soon --Holding metformin  Code Status: FULL Family Communication: Patient alone at time of admission Disposition Plan: Expect her to be here at least two midnights  Time spent: 60 minutes  [employer] [facility]  10/15/2015, 11:08 PM

## 2015-10-15 NOTE — ED Provider Notes (Signed)
CSN: [id number]     Arrival date & time 10/15/15  1759 History   First MD Initiated Contact with Patient 10/15/15 1951     Chief Complaint  Patient presents with  . Abdominal Pain     (Consider location/radiation/quality/duration/timing/severity/associated sxs/prior Treatment) HPI [name] is a 50 y.o. female with hx of DM, GERD, pancreatitis, presents to ED with complaint of abdominal pain, vomiting. Pt states her symptoms started yesterday. She was seen this morning, found to have elevated lipase of 200, but states felt better and went home. Pt also states she was uanble to go through with a CT of her abdomen bc of claustrophobia. Pt states since coming home, she continued to have abdominal pain and vomiting. She took 2x percocet which did not help. Reports pain similar to prior pancreatitis. States this is recurrent and states no one was able to figure out the cause. No prior abdominal surgeries.   Past Medical History  Diagnosis Date  . Pancreatitis   . Diabetes mellitus   . Bronchitis   . GERD (gastroesophageal reflux disease)   . Sleep apnea   . Acute pancreatitis 09/30/2010  . Ulcer   . Family history of malignant neoplasm of breast    Past Surgical History  Procedure Laterality Date  . Cesarean section    . Bunionectomy    . Wisdom tooth extraction  1986   Family History  Problem Relation Age of Onset  . Breast cancer Mother 88    currently 71; TAH/BSO ~38yo; only child  . Heart disease Father     SVT  . Diabetes Father   . Colon polyps Father   . Colon cancer Paternal Grandfather     dx 28s; deceased 4s  . Diabetes Sister   . Heart disease Sister   . Allergies Son   . Allergies Sister    Social History  Substance Use Topics  . Smoking status: Never Smoker   . Smokeless tobacco: Never Used  . Alcohol Use: No   OB History    No data available     Review of Systems  Constitutional: Positive for fatigue. Negative for fever and chills.  Respiratory:  Negative for cough, chest tightness and shortness of breath.   Cardiovascular: Negative for chest pain, palpitations and leg swelling.  Gastrointestinal: Positive for nausea, vomiting and abdominal pain. Negative for diarrhea.  Genitourinary: Negative for dysuria, flank pain and pelvic pain.  Musculoskeletal: Negative for myalgias, arthralgias, neck pain and neck stiffness.  Skin: Negative for rash.  Neurological: Positive for weakness. Negative for dizziness and headaches.  All other systems reviewed and are negative.     Allergies  Erythromycin; Other; and Sulfa drugs cross reactors  Home Medications   Prior to Admission medications   Medication Sig Start Date End Date Taking? Authorizing Provider  calcium citrate-vitamin D (CITRACAL+D) 315-200 MG-UNIT per tablet Take 1 tablet by mouth 2 (two) times daily.     Yes Historical Provider, MD  cholecalciferol (VITAMIN D) 1000 units tablet Take 1,000 Units by mouth daily.   Yes Historical Provider, MD  Digestive Enzymes (MULTI-ENZYME) TABS Take 1 tablet by mouth 3 (three) times daily.    Yes Historical Provider, MD  fenofibrate 160 MG tablet Take 160 mg by mouth every morning.    Yes Historical Provider, MD  fluticasone (FLONASE) 50 MCG/ACT nasal spray Place 2 sprays into the nose at bedtime.    Yes Historical Provider, MD  loratadine (CLARITIN) 10 MG tablet Take 10 mg by  mouth every morning.   Yes Historical Provider, MD  Melatonin 1 MG CAPS Take 1 mg by mouth every evening.   Yes Historical Provider, MD  metFORMIN (GLUCOPHAGE-XR) 500 MG 24 hr tablet Take 500-1,000 mg by mouth 2 (two) times daily. Take 1 tablet with breakfast and lunch and 2 tablets with dinner   Yes Historical Provider, MD  Multiple Vitamin (MULTIVITAMIN WITH MINERALS) TABS tablet Take 2 tablets by mouth 2 (two) times daily.   Yes Historical Provider, MD  norethindrone-ethinyl estradiol (MICROGESTIN,JUNEL,LOESTRIN) 1-20 MG-MCG tablet Take 1 tablet by mouth daily.   Yes  Historical Provider, MD  Omega-3 Fatty Acids (FISH OIL) 1200 MG CAPS Take 1,200 mg by mouth 2 (two) times daily.   Yes Historical Provider, MD  OVER THE COUNTER MEDICATION Take 1 tablet by mouth 2 (two) times daily. [employer]la-c [place] groseberry   Yes Historical Provider, MD  OVER THE COUNTER MEDICATION Take 1 capsule by mouth every morning. relshi mushroom   Yes Historical Provider, MD  OVER THE COUNTER MEDICATION Take 10 mLs by mouth 2 (two) times daily. L-glutamne   Yes Historical Provider, MD  oxyCODONE-acetaminophen (PERCOCET) 5-325 MG tablet Take 1-2 tablets by mouth every 6 (six) hours as needed. Patient taking differently: Take 1-2 tablets by mouth every 6 (six) hours as needed for severe pain.  10/15/15  Yes April Palumbo, MD  Probiotic Product (PROBIOTIC PO) Take 1 tablet by mouth 2 (two) times daily.   Yes Historical Provider, MD  Quercetin 250 MG TABS Take 500 mg by mouth 2 (two) times daily.   Yes Historical Provider, MD  simvastatin (ZOCOR) 40 MG tablet Take 40 mg by mouth at bedtime.     Yes Historical Provider, MD  Turmeric 500 MG CAPS Take 1 tablet by mouth every morning.   Yes Historical Provider, MD  VALERIAN ROOT PO Take 1,020 mg by mouth every evening.   Yes Historical Provider, MD  ADVAIR DISKUS 250-50 MCG/DOSE AEPB Inhale 1 spray into the lungs 2 (two) times daily as needed (for SOB).  10/11/15   Historical Provider, MD  azithromycin (ZITHROMAX) 250 MG tablet Take 250 mg by mouth daily. Reported on 10/15/2015 10/10/15   Historical Provider, MD  ondansetron (ZOFRAN ODT) 8 MG disintegrating tablet  ODT q8 hours prn nausea Patient taking differently: Take 8 mg by mouth every 8 (eight) hours as needed for nausea or vomiting.  ODT q8 hours prn nausea 10/15/15   April Palumbo, MD   BP 133/93 mmHg  Pulse 104  Temp(Src) 98.4 F (36.9 C) (Oral)  Resp 16  SpO2 95%  LMP 09/22/2015 (Approximate) Physical Exam  Constitutional: She is oriented to person, place, and time. She appears  well-developed and well-nourished. No distress.  HENT:  Head: Normocephalic.  Oral mucosa dry  Eyes: Conjunctivae are normal.  Neck: Neck supple.  Cardiovascular: Normal rate, regular rhythm and normal heart sounds.   Pulmonary/Chest: Effort normal and breath sounds normal. No respiratory distress. She has no wheezes. She has no rales.  Abdominal: Soft. Bowel sounds are normal. She exhibits no distension. There is tenderness. There is no rebound.  Diffuse tenderness  Musculoskeletal: She exhibits no edema.  Neurological: She is alert and oriented to person, place, and time.  Skin: Skin is warm and dry.  Psychiatric: She has a normal mood and affect. Her behavior is normal.  Nursing note and vitals reviewed.   ED Course  Procedures (including critical care time) Labs Review Labs Reviewed  CBC WITH DIFFERENTIAL/PLATELET - Abnormal;  Notable for the following:    Neutro Abs 8.8 (*)    All other components within normal limits  COMPREHENSIVE METABOLIC PANEL - Abnormal; Notable for the following:    Sodium 131 (*)    Chloride 99 (*)    CO2 11 (*)    Glucose, Bld 263 (*)    Calcium 8.3 (*)    Albumin 3.1 (*)    Total Bilirubin 1.8 (*)    Anion gap 21 (*)    All other components within normal limits  LIPASE, BLOOD - Abnormal; Notable for the following:    Lipase 942 (*)    All other components within normal limits    Imaging Review No results found. I have personally reviewed and evaluated these images and lab results as part of my medical decision-making.   EKG Interpretation None      MDM   Final diagnoses:  Acute pancreatitis, unspecified pancreatitis type  Diabetic ketoacidosis without coma associated with type 2 diabetes mellitus (HCC)   Pt with recurrent pancreatitis, here with recurrent symptoms. Was seen this morning, symptoms worsening. VS normal other than mild tachycardia. BP normal. Afebrile. Will recheck labs.    Lipase 942, pt is hyperglycemic, bicarb  11, anion gap 21. DKA? Ordered 2L of IV fluids.  Will call for admission. Pt feels much better after pain medications. VS remain stable. Also added ABG.   [facility]oke with [facility], will admit.  Filed Vitals:   10/15/15 2130 10/15/15 2145 10/15/15 2200 10/15/15 2215  BP: 116/73 133/93 131/84 130/79  Pulse: 97 104 96 102  Temp:      TempSrc:      Resp:      SpO2: 94% 95% 94% 96%     Jaynie Crumbleatyana Jakoby Melendrez, PA-C 10/16/15 0215  Mancel BaleElliott Wentz, MD 10/16/15 1433

## 2015-10-16 ENCOUNTER — Encounter (HOSPITAL_COMMUNITY): Payer: Self-pay | Admitting: Radiology

## 2015-10-16 ENCOUNTER — Inpatient Hospital Stay (HOSPITAL_COMMUNITY): Payer: BLUE CROSS/BLUE SHIELD

## 2015-10-16 DIAGNOSIS — E781 Pure hyperglyceridemia: Secondary | ICD-10-CM | POA: Diagnosis present

## 2015-10-16 LAB — BASIC METABOLIC PANEL
ANION GAP: 15 (ref 5–15)
ANION GAP: 15 (ref 5–15)
ANION GAP: 14 (ref 5–15)
ANION GAP: 13 (ref 5–15)
ANION GAP: 10 (ref 5–15)
ANION GAP: 8 (ref 5–15)
BUN: <5 mg/dL — ABNORMAL LOW (ref 6–20)
BUN: <5 mg/dL — ABNORMAL LOW (ref 6–20)
BUN: <5 mg/dL — ABNORMAL LOW (ref 6–20)
BUN: <5 mg/dL — ABNORMAL LOW (ref 6–20)
BUN: <5 mg/dL — ABNORMAL LOW (ref 6–20)
BUN: <5 mg/dL — ABNORMAL LOW (ref 6–20)
CALCIUM: 6.2 mg/dL — AB (ref 8.9–10.3)
CALCIUM: 6 mg/dL — AB (ref 8.9–10.3)
CALCIUM: 5.5 mg/dL — AB (ref 8.9–10.3)
CALCIUM: 5.2 mg/dL — AB (ref 8.9–10.3)
CHLORIDE: 105 mmol/L (ref 101–111)
CHLORIDE: 106 mmol/L (ref 101–111)
CHLORIDE: 101 mmol/L (ref 101–111)
CHLORIDE: 110 mmol/L (ref 101–111)
CO2: 13 mmol/L — AB (ref 22–32)
CO2: 13 mmol/L — AB (ref 22–32)
CO2: 11 mmol/L — AB (ref 22–32)
CO2: 15 mmol/L — AB (ref 22–32)
CO2: 13 mmol/L — AB (ref 22–32)
CO2: 14 mmol/L — AB (ref 22–32)
Calcium: 6.9 mg/dL — ABNORMAL LOW (ref 8.9–10.3)
Calcium: 6.7 mg/dL — ABNORMAL LOW (ref 8.9–10.3)
Chloride: 101 mmol/L (ref 101–111)
Chloride: 107 mmol/L (ref 101–111)
Creatinine, Ser: 0.75 mg/dL (ref 0.44–1.00)
Creatinine, Ser: 0.71 mg/dL (ref 0.44–1.00)
Creatinine, Ser: <0.3 mg/dL — ABNORMAL LOW (ref 0.44–1.00)
Creatinine, Ser: 0.6 mg/dL (ref 0.44–1.00)
Creatinine, Ser: 0.46 mg/dL (ref 0.44–1.00)
Creatinine, Ser: 0.45 mg/dL (ref 0.44–1.00)
GFR calc non Af Amer: >60 mL/min (ref >60)
GFR calc non Af Amer: >60 mL/min (ref >60)
GFR calc non Af Amer: >60 mL/min (ref >60)
GFR calc non Af Amer: >60 mL/min (ref >60)
GLUCOSE: 266 mg/dL — AB (ref 65–99)
GLUCOSE: 258 mg/dL — AB (ref 65–99)
GLUCOSE: 213 mg/dL — AB (ref 65–99)
Glucose, Bld: 217 mg/dL — ABNORMAL HIGH (ref 65–99)
Glucose, Bld: 128 mg/dL — ABNORMAL HIGH (ref 65–99)
Glucose, Bld: 175 mg/dL — ABNORMAL HIGH (ref 65–99)
POTASSIUM: 3.7 mmol/L (ref 3.5–5.1)
POTASSIUM: 4.1 mmol/L (ref 3.5–5.1)
POTASSIUM: 3.3 mmol/L — AB (ref 3.5–5.1)
POTASSIUM: 4 mmol/L (ref 3.5–5.1)
POTASSIUM: 3.9 mmol/L (ref 3.5–5.1)
Potassium: 4 mmol/L (ref 3.5–5.1)
Sodium: 133 mmol/L — ABNORMAL LOW (ref 135–145)
Sodium: 134 mmol/L — ABNORMAL LOW (ref 135–145)
Sodium: 126 mmol/L — ABNORMAL LOW (ref 135–145)
Sodium: 138 mmol/L (ref 135–145)
Sodium: 124 mmol/L — ABNORMAL LOW (ref 135–145)
Sodium: 129 mmol/L — ABNORMAL LOW (ref 135–145)

## 2015-10-16 LAB — CBC
HCT: 38.5 % (ref 36.0–46.0)
Hemoglobin: 13.3 g/dL (ref 12.0–15.0)
MCH: 31.3 pg (ref 26.0–34.0)
MCHC: 34.5 g/dL (ref 30.0–36.0)
MCV: 90.6 fL (ref 78.0–100.0)
PLATELETS: 331 10*3/uL (ref 150–400)
RBC: 4.25 MIL/uL (ref 3.87–5.11)
RDW: 15 % (ref 11.5–15.5)
WBC: 7.6 10*3/uL (ref 4.0–10.5)

## 2015-10-16 LAB — LIPID PANEL
Cholesterol: 801 mg/dL — ABNORMAL HIGH (ref 0–200)
LDL Cholesterol: UNDETERMINED mg/dL (ref 0–99)
Triglycerides: >5000 mg/dL — ABNORMAL HIGH (ref <150)
VLDL: UNDETERMINED mg/dL (ref 0–40)

## 2015-10-16 LAB — CBG MONITORING, ED
GLUCOSE-CAPILLARY: 236 mg/dL — AB (ref 65–99)
GLUCOSE-CAPILLARY: 224 mg/dL — AB (ref 65–99)
GLUCOSE-CAPILLARY: 208 mg/dL — AB (ref 65–99)
Glucose-Capillary: 233 mg/dL — ABNORMAL HIGH (ref 65–99)
Glucose-Capillary: 248 mg/dL — ABNORMAL HIGH (ref 65–99)
Glucose-Capillary: 209 mg/dL — ABNORMAL HIGH (ref 65–99)

## 2015-10-16 LAB — GLUCOSE, CAPILLARY
GLUCOSE-CAPILLARY: 150 mg/dL — AB (ref 65–99)
GLUCOSE-CAPILLARY: 133 mg/dL — AB (ref 65–99)
Glucose-Capillary: 136 mg/dL — ABNORMAL HIGH (ref 65–99)
Glucose-Capillary: 149 mg/dL — ABNORMAL HIGH (ref 65–99)
Glucose-Capillary: 155 mg/dL — ABNORMAL HIGH (ref 65–99)
Glucose-Capillary: 170 mg/dL — ABNORMAL HIGH (ref 65–99)
Glucose-Capillary: 172 mg/dL — ABNORMAL HIGH (ref 65–99)
Glucose-Capillary: 177 mg/dL — ABNORMAL HIGH (ref 65–99)
Glucose-Capillary: 186 mg/dL — ABNORMAL HIGH (ref 65–99)
Glucose-Capillary: 190 mg/dL — ABNORMAL HIGH (ref 65–99)
Glucose-Capillary: 214 mg/dL — ABNORMAL HIGH (ref 65–99)
Glucose-Capillary: 216 mg/dL — ABNORMAL HIGH (ref 65–99)
Glucose-Capillary: 223 mg/dL — ABNORMAL HIGH (ref 65–99)

## 2015-10-16 LAB — MRSA PCR SCREENING: MRSA BY PCR: NEGATIVE

## 2015-10-16 LAB — MAGNESIUM: MAGNESIUM: 1.4 mg/dL — AB (ref 1.7–2.4)

## 2015-10-16 LAB — LIPASE, BLOOD: LIPASE: 1170 U/L — AB (ref 11–51)

## 2015-10-16 LAB — ALBUMIN: ALBUMIN: 2.4 g/dL — AB (ref 3.5–5.0)

## 2015-10-16 LAB — POC URINE PREG, ED: Preg Test, Ur: NEGATIVE

## 2015-10-16 MED ORDER — SODIUM CHLORIDE 0.9 % IV SOLN
2.0000 g | Freq: Once | INTRAVENOUS | Status: AC
  Administered 2015-10-16: 2 g via INTRAVENOUS
  Filled 2015-10-16: qty 20

## 2015-10-16 MED ORDER — CALCIUM GLUCONATE 10 % IV SOLN
1.0000 g | Freq: Once | INTRAVENOUS | Status: AC
  Administered 2015-10-16: 1 g via INTRAVENOUS
  Filled 2015-10-16: qty 10

## 2015-10-16 MED ORDER — HYDROMORPHONE HCL 1 MG/ML IJ SOLN
1.0000 mg | INTRAMUSCULAR | Status: DC | PRN
  Administered 2015-10-16 (×3): 1 mg via INTRAVENOUS
  Filled 2015-10-16 (×3): qty 1

## 2015-10-16 MED ORDER — SODIUM CHLORIDE 0.9 % IV SOLN
1.0000 g | Freq: Once | INTRAVENOUS | Status: AC
  Administered 2015-10-16: 1 g via INTRAVENOUS
  Filled 2015-10-16: qty 10

## 2015-10-16 MED ORDER — FLUTICASONE PROPIONATE 50 MCG/ACT NA SUSP
1.0000 | Freq: Every day | NASAL | Status: DC
  Administered 2015-10-17 – 2015-10-25 (×10): 1 via NASAL
  Filled 2015-10-16 (×3): qty 16

## 2015-10-16 MED ORDER — SODIUM CHLORIDE 0.9% FLUSH
3.0000 mL | Freq: Two times a day (BID) | INTRAVENOUS | Status: DC
  Administered 2015-10-16 – 2015-10-26 (×12): 3 mL via INTRAVENOUS

## 2015-10-16 MED ORDER — ACETAMINOPHEN 325 MG PO TABS
650.0000 mg | ORAL_TABLET | Freq: Four times a day (QID) | ORAL | Status: DC | PRN
  Administered 2015-10-22 – 2015-10-26 (×9): 650 mg via ORAL
  Filled 2015-10-16 (×9): qty 2

## 2015-10-16 MED ORDER — OXYCODONE HCL 5 MG PO TABS
5.0000 mg | ORAL_TABLET | ORAL | Status: DC | PRN
  Administered 2015-10-16 – 2015-10-18 (×2): 5 mg via ORAL
  Filled 2015-10-16 (×2): qty 1

## 2015-10-16 MED ORDER — HYDROMORPHONE HCL 1 MG/ML IJ SOLN
2.0000 mg | INTRAMUSCULAR | Status: DC | PRN
Start: 2015-10-16 — End: 2015-10-19
  Administered 2015-10-16 – 2015-10-19 (×12): 2 mg via INTRAVENOUS
  Filled 2015-10-16 (×12): qty 2

## 2015-10-16 MED ORDER — MAGNESIUM SULFATE 50 % IJ SOLN
4.0000 g | Freq: Once | INTRAVENOUS | Status: AC
  Administered 2015-10-16: 4 g via INTRAVENOUS
  Filled 2015-10-16: qty 8

## 2015-10-16 MED ORDER — ACETAMINOPHEN 650 MG RE SUPP: 650.0000 mg | Freq: Four times a day (QID) | RECTAL | Status: DC | PRN

## 2015-10-16 MED ORDER — IOPAMIDOL (ISOVUE-300) INJECTION 61%
INTRAVENOUS | Status: AC
  Administered 2015-10-16: 100 mL
  Filled 2015-10-16: qty 100

## 2015-10-16 MED ORDER — FENOFIBRATE 160 MG PO TABS
160.0000 mg | ORAL_TABLET | Freq: Every day | ORAL | Status: DC
  Administered 2015-10-16 – 2015-10-26 (×11): 160 mg via ORAL
  Filled 2015-10-16 (×11): qty 1

## 2015-10-16 MED ORDER — PANTOPRAZOLE SODIUM 40 MG IV SOLR
40.0000 mg | INTRAVENOUS | Status: DC
  Administered 2015-10-16 – 2015-10-25 (×10): 40 mg via INTRAVENOUS
  Filled 2015-10-16 (×10): qty 40

## 2015-10-16 MED ORDER — POTASSIUM CHLORIDE 10 MEQ/100ML IV SOLN
10.0000 meq | INTRAVENOUS | Status: AC
  Administered 2015-10-16 (×4): 10 meq via INTRAVENOUS
  Filled 2015-10-16 (×4): qty 100

## 2015-10-16 NOTE — ED Notes (Signed)
CBG was 224. 

## 2015-10-16 NOTE — Progress Notes (Signed)
CRITICAL VALUE ALERT  Critical value received:  Calcium 5.2  Date of notification:  10/16/2015  Time of notification:  2303  Critical value read back:Yes.    Nurse who received alert:  Ernestine ConradMichelle Evangelista    MD notified (1st page):  Tama GanderKatherine Schorr, NP  Time of first page:  2307  MD notified (2nd page):  Time of second page:  Responding MD:    Time MD responded:

## 2015-10-16 NOTE — Progress Notes (Signed)
[facility]                                                                              Patient Demographics  [name], is a 50 y.o. female, DOB - September 17, 1965, [id number]  Admit date - 10/15/2015   Admitting Physician Michael Litter, MD  Outpatient Primary MD for the patient is Hoyle Sauer, MD  LOS - 1  days    Chief Complaint  Patient presents with  . Abdominal Pain       Brief HPI   [name] is a 50 y.o. woman with a history of Type 2 DM (level of control unknown; treated with metformin), GERD, and recurrent pancreatitis (2010, 2012, 2014; etiology unclear) who had presented earlier today for evaluation of abdominal pain. She had a mild elevation of her lipase at that time (204), but she seemed to improved with anti-emetics and analgesics and was discharged to home with plans to follow-up with outpatient providers. Unfortunately, the patient had an escalation in her pain symptoms after passing her oral challenge in the ED. She describes a throbbing left-sided pain that radiates to her epigastric area, 7-8 out of 10 in intensity at its worst. She has had three episodes of diarrhea.She returned to the ED tonight and was found to have a lipase level that has increased to 942. She also has a bicarb level of 11 and an anion gap of 21, +ketones on her U/A.   Assessment & Plan    Acute recurrent pancreatitis: Likely due to severe hypertriglyceridemia - CT abdomen consistent with acute pancreatitis - Lipid panel showed triglycerides over 5000 - Continue nothing by mouth, aggressive IV fluid hydration, bowel rest   Hyponatremia, hypocalcemia - Likely due to profound dehydration, acute pancreatitis and DKA - Continue IV fluid hydration, IV calcium replacement  Hypertriglyceridemia - Placed on Tricor  DKA, anion gap of 21 with bicarb of 11 and ketones in urine - BMET showed gap still 14, CBGs in 200s, with severe pancreatitis, patient will be  unable to tolerate anything oral. - Continue IV fluids and IV insulin glucose stabilizer - Continue to hold metformin   Code Status: full code   Family Communication: Discussed in detail with the patient, all imaging results, lab results explained to the patient    Disposition Plan: keep in sdu today  Time Spent in minutes  25 minutes  Procedures  CT abd  Consults   None   DVT Prophylaxis   SCD's  Medications  Scheduled Meds: . calcium gluconate  1 g Intravenous Once  . fenofibrate  160 mg Oral Daily  . fluticasone  1 spray Each Nare QHS  . pantoprazole (PROTONIX) IV  40 mg Intravenous Q24H  . sodium chloride flush  3 mL Intravenous Q12H   Continuous Infusions: . sodium chloride    . dextrose 5 % and 0.45% NaCl 125 mL/hr at 10/16/15 0757  . insulin (NOVOLIN-R) infusion 13 Units/hr (10/16/15 1041)   PRN Meds:.acetaminophen **OR** acetaminophen, HYDROmorphone (DILAUDID) injection, ondansetron **OR** ondansetron (ZOFRAN) IV, oxyCODONE   Antibiotics   Anti-infectives    None        Subjective:  [name] was seen and examined today. Screaming with pain, 10 out of 10, epigastric, uncomfortable, nausea. Denies any chest pain or shortness of breath. No vomiting currently no fevers or chills  Objective:   Filed Vitals:   10/16/15 0500 10/16/15 0700 10/16/15 0730 10/16/15 0757  BP: 128/80 137/81 164/79 134/78  Pulse: 125  108 105  Temp:      TempSrc:      Resp: SpO2: 100%  91% 97%    Intake/Output Summary (Last 24 hours) at 10/16/15 1133 Last data filed at 10/16/15 0328  Gross per 24 hour  Intake   2000 ml  Output      0 ml  Net   2000 ml     Wt Readings from Last 3 Encounters:  08/06/13 90.538 kg (199 lb 9.6 oz)  08/02/13 91.989 kg (202 lb 12.8 oz)  07/19/13 91.354 kg (201 lb 6.4 oz)     Exam  General: Alert and oriented x 3, Very uncomfortable due to pain  HEENT:  PERRLA, EOMI, Anicteric Sclera, mucous membranes moist.    Neck: Supple, no JVD, no masses  CVS: S1 S2 auscultated, no rubs, murmurs or gallops. Regular rate and rhythm.  Respiratory: Clear to auscultation bilaterally, no wheezing, rales or rhonchi  Abdomen: Morbidly obese, Soft, diffuse tenderness to deep palpation, worse in epigastric region  Ext: no cyanosis clubbing or edema  Neuro: AAOx3, Cr N's II- XII. Strength 5/5 upper and lower extremities bilaterally  Skin: No rashes  Psych: anxious and very uncomfortable   Data Reviewed:  I have personally reviewed following labs and imaging studies  Micro Results No results found for this or any previous visit (from the past 240 hour(s)).  Radiology Reports Ct Abdomen Pelvis W Contrast  10/16/2015  CLINICAL DATA:  Left lower quadrant abdominal pain EXAM: CT ABDOMEN AND PELVIS WITH CONTRAST TECHNIQUE: Multidetector CT imaging of the abdomen and pelvis was performed using the standard protocol following bolus administration of intravenous contrast. CONTRAST:  1 ISOVUE-300 IOPAMIDOL (ISOVUE-300) INJECTION 61% COMPARISON:  Ultrasound 10/05/2010 CT 04/01/10 FINDINGS: Lower chest: Mild atelectatic appearing patchy lung base opacities bilaterally. No effusions. Hepatobiliary: There are normal appearances of the liver, gallbladder and bile ducts. Pancreas: There is intense inflammatory stranding around the pancreas. There is small-moderate volume peripancreatic fluid. The pancreatic parenchyma enhances uniformly. No pancreatic duct dilatation. No abscess. Spleen: Normal Adrenals/Urinary Tract: The adrenals and kidneys are normal in appearance. There is no urinary calculus evident. There is no hydronephrosis or ureteral dilatation. Collecting systems and ureters appear unremarkable. Stomach/Bowel: There are normal appearances of the stomach, small bowel and colon. The appendix is normal. Vascular/Lymphatic: The abdominal aorta is normal in caliber. There is no atherosclerotic calcification. There is no  adenopathy in the abdomen or pelvis. Reproductive: Normal uterus and ovaries Other: Small fat containing umbilical hernia Musculoskeletal: No significant skeletal lesions IMPRESSION: Acute pancreatitis with intense inflammation and small-moderate peripancreatic fluid. No evidence of pancreatic necrosis. No abscess. Electronically Signed   By: Ellery Plunk M.D.   On: 10/16/2015 04:56    CBC  Recent Labs Lab 10/15/15 0433 10/15/15 0441 10/15/15 2034 10/16/15 0624  WBC 6.8  --  10.3 7.6  HGB 10.8* 15.0 13.1 13.3  HCT 31.0* 44.0 38.1 38.5  PLT 238  --  308 331  MCV 89.3  --  87.8 90.6  MCH 31.1  --  30.2 31.3  MCHC 34.8  --  34.4 34.5  RDW 14.0  --  13.8 15.0  LYMPHSABS 1.2  --  0.9  --   MONOABS 0.3  --  0.6  --   EOSABS 0.1  --  0.0  --   BASOSABS 0.0  --  0.0  --     Chemistries   Recent Labs Lab 10/15/15 2034 10/15/15 2321 10/16/15 0345 10/16/15 0624 10/16/15 0940  NA 131* 134* 133* 134* 126*  K 4.1 4.0 4.0 3.7 4.1  CL 99* 105 105 106 101  CO2 11* 11* 13* 13* 11*  GLUCOSE 263* 250* 266* 258* 213*  BUN 7 6 <5* <5* <5*  CREATININE 0.89 0.70 0.75 0.71 <0.30*  CALCIUM 8.3* 7.9* 6.9* 6.7* 6.2*  AST 20  --   --   --   --   ALT 19  --   --   --   --   ALKPHOS 63  --   --   --   --   BILITOT 1.8*  --   --   --   --    ------------------------------------------------------------------------------------------------------------------ CrCl cannot be calculated (Unknown ideal weight.). ------------------------------------------------------------------------------------------------------------------ No results for input(s): HGBA1C in the last 72 hours. ------------------------------------------------------------------------------------------------------------------  Recent Labs  10/16/15 0624  CHOL 801*  HDL NOT REPORTED DUE TO HIGH TRIGLYCERIDES  LDLCALC UNABLE TO CALCULATE IF TRIGLYCERIDE OVER 400 mg/dL  TRIG >1610>5000*  CHOLHDL NOT REPORTED DUE TO HIGH TRIGLYCERIDES    ------------------------------------------------------------------------------------------------------------------ No results for input(s): TSH, T4TOTAL, T3FREE, THYROIDAB in the last 72 hours.  Invalid input(s): FREET3 ------------------------------------------------------------------------------------------------------------------ No results for input(s): VITAMINB12, FOLATE, FERRITIN, TIBC, IRON, RETICCTPCT in the last 72 hours.  Coagulation profile No results for input(s): INR, PROTIME in the last 168 hours.  No results for input(s): DDIMER in the last 72 hours.  Cardiac Enzymes No results for input(s): CKMB, TROPONINI, MYOGLOBIN in the last 168 hours.  Invalid input(s): CK ------------------------------------------------------------------------------------------------------------------ Invalid input(s): POCBNP   Recent Labs  10/16/15 0106 10/16/15 0211 10/16/15 0306 10/16/15 0440 10/16/15 0709 10/16/15 0825  GLUCAP 236* 233* 248* 224* 209* 208*     [name] M.D. [facility] 10/16/2015, 11:33 AM  Pager: [phone] Between 7am to 7pm - call Pager - [phone]  After 7pm go to [email or web address] - password [email or web address]  Call night coverage person covering after 7pm

## 2015-10-16 NOTE — Progress Notes (Signed)
CRITICAL VALUE ALERT  Critical value received:  Calcium 5.5  Date of notification:  10/16/2015  Time of notification:  1944  Critical value read back:Yes.    Nurse who received alert:  Ricky StabsKelly Doreene Forrey  MD notified (1st page):  Tama GanderKatherine Schorr, NP  Time of first page:  1958  MD notified (2nd page):  Time of second page:  Responding MD:    Time MD responded:

## 2015-10-16 NOTE — Progress Notes (Signed)
CRITICAL VALUE ALERT  Critical value received:  Calcium 6.2  Date of notification:  10/16/15  Time of notification:  1115  Critical value read back:Yes.    Nurse who received alert:  Ronna PolioJennifer Conner Muegge  MD notified (1st page):  Rai, MD  Time of first page:  1120  MD notified (2nd page):  Time of second page:  Responding MD:  Isidoro Donningai, MD  Time MD responded:  [phone]

## 2015-10-16 NOTE — Progress Notes (Signed)
MD notified via text page of patients latest set of labs. Mg 1.4, Albumin 2.4, K 3.3, Calcium 6.0, CO2 15, AG 13

## 2015-10-17 DIAGNOSIS — E1165 Type 2 diabetes mellitus with hyperglycemia: Secondary | ICD-10-CM

## 2015-10-17 LAB — COMPREHENSIVE METABOLIC PANEL
ALBUMIN: 2.1 g/dL — AB (ref 3.5–5.0)
ALK PHOS: 48 U/L (ref 38–126)
ALT: 10 U/L — AB (ref 14–54)
AST: 39 U/L (ref 15–41)
Anion gap: 9 (ref 5–15)
CALCIUM: 5.9 mg/dL — AB (ref 8.9–10.3)
CO2: 18 mmol/L — AB (ref 22–32)
CREATININE: 0.57 mg/dL (ref 0.44–1.00)
Chloride: 108 mmol/L (ref 101–111)
GFR calc Af Amer: >60 mL/min (ref >60)
GFR calc non Af Amer: >60 mL/min (ref >60)
GLUCOSE: 147 mg/dL — AB (ref 65–99)
Potassium: 4 mmol/L (ref 3.5–5.1)
SODIUM: 135 mmol/L (ref 135–145)
TOTAL PROTEIN: 5.3 g/dL — AB (ref 6.5–8.1)
Total Bilirubin: 1.8 mg/dL — ABNORMAL HIGH (ref 0.3–1.2)

## 2015-10-17 LAB — GLUCOSE, CAPILLARY
GLUCOSE-CAPILLARY: 120 mg/dL — AB (ref 65–99)
GLUCOSE-CAPILLARY: 135 mg/dL — AB (ref 65–99)
GLUCOSE-CAPILLARY: 144 mg/dL — AB (ref 65–99)
GLUCOSE-CAPILLARY: 148 mg/dL — AB (ref 65–99)
GLUCOSE-CAPILLARY: 150 mg/dL — AB (ref 65–99)
GLUCOSE-CAPILLARY: 157 mg/dL — AB (ref 65–99)
GLUCOSE-CAPILLARY: 158 mg/dL — AB (ref 65–99)
GLUCOSE-CAPILLARY: 164 mg/dL — AB (ref 65–99)
GLUCOSE-CAPILLARY: 169 mg/dL — AB (ref 65–99)
Glucose-Capillary: 148 mg/dL — ABNORMAL HIGH (ref 65–99)
Glucose-Capillary: 153 mg/dL — ABNORMAL HIGH (ref 65–99)
Glucose-Capillary: 160 mg/dL — ABNORMAL HIGH (ref 65–99)
Glucose-Capillary: 147 mg/dL — ABNORMAL HIGH (ref 65–99)
Glucose-Capillary: 153 mg/dL — ABNORMAL HIGH (ref 65–99)
Glucose-Capillary: 205 mg/dL — ABNORMAL HIGH (ref 65–99)
Glucose-Capillary: 170 mg/dL — ABNORMAL HIGH (ref 65–99)
Glucose-Capillary: 174 mg/dL — ABNORMAL HIGH (ref 65–99)
Glucose-Capillary: 129 mg/dL — ABNORMAL HIGH (ref 65–99)

## 2015-10-17 LAB — CBC
HEMATOCRIT: 37.5 % (ref 36.0–46.0)
HEMOGLOBIN: 13 g/dL (ref 12.0–15.0)
MCH: 31.2 pg (ref 26.0–34.0)
MCHC: 34.7 g/dL (ref 30.0–36.0)
MCV: 89.9 fL (ref 78.0–100.0)
Platelets: 346 10*3/uL (ref 150–400)
RBC: 4.17 MIL/uL (ref 3.87–5.11)
RDW: 15 % (ref 11.5–15.5)
WBC: 6.8 10*3/uL (ref 4.0–10.5)

## 2015-10-17 LAB — LIPASE, BLOOD: Lipase: 192 U/L — ABNORMAL HIGH (ref 11–51)

## 2015-10-17 MED ORDER — SODIUM CHLORIDE 0.9 % IV SOLN
1.0000 g | Freq: Two times a day (BID) | INTRAVENOUS | Status: DC
  Administered 2015-10-17 – 2015-10-20 (×8): 1 g via INTRAVENOUS
  Filled 2015-10-17 (×11): qty 10

## 2015-10-17 MED ORDER — PROCHLORPERAZINE EDISYLATE 5 MG/ML IJ SOLN
10.0000 mg | Freq: Once | INTRAMUSCULAR | Status: DC
  Filled 2015-10-17: qty 2

## 2015-10-17 MED ORDER — LORAZEPAM 0.5 MG PO TABS
0.5000 mg | ORAL_TABLET | Freq: Four times a day (QID) | ORAL | Status: DC | PRN
  Administered 2015-10-18 (×2): 0.5 mg via ORAL
  Filled 2015-10-17 (×2): qty 1

## 2015-10-17 MED ORDER — SODIUM CHLORIDE 0.9 % IV SOLN
1.0000 g | Freq: Once | INTRAVENOUS | Status: AC
  Administered 2015-10-17: 1 g via INTRAVENOUS
  Filled 2015-10-17: qty 10

## 2015-10-17 NOTE — Progress Notes (Signed)
[facility]                                                                              Patient Demographics  [name], is a 50 y.o. female, DOB - Jan 01, 1966, [id number]  Admit date - 10/15/2015   Admitting Physician Michael Litter, MD  Outpatient Primary MD for the patient is Hoyle Sauer, MD  LOS - 2  days    Chief Complaint  Patient presents with  . Abdominal Pain       Brief HPI   [name] is a 50 y.o. woman with a history of Type 2 DM (level of control unknown; treated with metformin), GERD, and recurrent pancreatitis (2010, 2012, 2014; etiology unclear) who had presented earlier today for evaluation of abdominal pain. She had a mild elevation of her lipase at that time (204), but she seemed to improved with anti-emetics and analgesics and was discharged to home with plans to follow-up with outpatient providers. Unfortunately, the patient had an escalation in her pain symptoms after passing her oral challenge in the ED. She describes a throbbing left-sided pain that radiates to her epigastric area, 7-8 out of 10 in intensity at its worst. She has had three episodes of diarrhea.She returned to the ED tonight and was found to have a lipase level that has increased to 942. She also has a bicarb level of 11 and an anion gap of 21, +ketones on her U/A.   Assessment & Plan    Acute recurrent pancreatitis: Likely due to severe hypertriglyceridemia - CT abdomen consistent with acute pancreatitis - Lipid panel showed triglycerides over 5000 - Continue NPO, aggressive IV fluid hydration, bowel rest - Feeling slightly better today   Hyponatremia, hypocalcemia - Likely due to profound dehydration, acute pancreatitis and DKA - Continue IV fluid hydration, IV calcium replacement BID   Severe Hypertriglyceridemia - Placed on Tricor, will also place on low-dose statin at discharge. Repeat lipid panel near discharge.   - Rule out familial  hypertriglyceridemia, recommended patient to follow with endocrinology outpatient  DKA, anion gap of 21 with bicarb of 11 and ketones in urine - Gap has closed bicarbonate still 14, however patient has severe pancreatitis and NPO -will continue IV fluids and IV insulin glucose stabilizer - Continue to hold metformin - Diabetic coordinator consult and nutrition consult once back on diet, patient is motivated to continue insulin at the time of discharge.   Code Status: full code   Family Communication: Discussed in detail with the patient, all imaging results, lab results explained to the patient    Disposition Plan: keep in sdu today  Time Spent in minutes  25 minutes  Procedures  CT abd  Consults   None   DVT Prophylaxis   SCD's  Medications  Scheduled Meds: . fenofibrate  160 mg Oral Daily  . fluticasone  1 spray Each Nare QHS  . pantoprazole (PROTONIX) IV  40 mg Intravenous Q24H  . prochlorperazine  10 mg Intravenous Once  . sodium chloride flush  3 mL Intravenous Q12H   Continuous Infusions: . sodium chloride Stopped (10/17/15 0730)  . dextrose 5 % and  0.45% NaCl 125 mL/hr at 10/17/15 0103  . insulin (NOVOLIN-R) infusion 7.2 Units/hr (10/17/15 0959)   PRN Meds:.acetaminophen **OR** acetaminophen, HYDROmorphone (DILAUDID) injection, LORazepam, ondansetron **OR** ondansetron (ZOFRAN) IV, oxyCODONE   Antibiotics   Anti-infectives    None        Subjective:   [name] was seen and examined today. Abdominal pain improving, anxiety + , nausea otherwise no fevers, chills, chest pain, shortness of breath, vomiting, diarrhea. No acute events overnight.    Objective:   Filed Vitals:   10/17/15 0108 10/17/15 0400 10/17/15 0510 10/17/15 0730  BP: 118/72 121/77 116/69 120/72  Pulse: 102 99 103   Temp: 97.6 F (36.4 C) 98.2 F (36.8 C)  98.2 F (36.8 C)  TempSrc: Oral Oral  Oral  Resp: Height:      Weight:      SpO2: 91% 93% 94%      Intake/Output Summary (Last 24 hours) at 10/17/15 1021 Last data filed at 10/17/15 0730  Gross per 24 hour  Intake 3045.25 ml  Output    675 ml  Net 2370.25 ml     Wt Readings from Last 3 Encounters:  10/16/15 95.301 kg (210 lb 1.6 oz)  08/06/13 90.538 kg (199 lb 9.6 oz)  08/02/13 91.989 kg (202 lb 12.8 oz)     Exam  General: Alert and oriented x 3   HEENT:    Neck:   CVS: S1 S2 auscultated, no rubs, murmurs or gallops. Regular rate and rhythm.  Respiratory: Clear to auscultation bilaterally, no wheezing, rales or rhonchi  Abdomen: Morbidly obese, Soft, diffuse TTP, worse in epigastric region-improving   Ext: no cyanosis clubbing or edema  Neuro: moving all 4 extremities  Skin: No rashes  Psych: anxious   Data Reviewed:  I have personally reviewed following labs and imaging studies  Micro Results Recent Results (from the past 240 hour(s))  MRSA PCR Screening     Status: None   Collection Time: 10/16/15 10:34 AM  Result Value Ref Range Status   MRSA by PCR NEGATIVE NEGATIVE Final    Comment:        The GeneXpert MRSA Assay (FDA approved for NASAL specimens only), is one component of a comprehensive MRSA colonization surveillance program. It is not intended to diagnose MRSA infection nor to guide or monitor treatment for MRSA infections.     Radiology Reports Ct Abdomen Pelvis W Contrast  10/16/2015  CLINICAL DATA:  Left lower quadrant abdominal pain EXAM: CT ABDOMEN AND PELVIS WITH CONTRAST TECHNIQUE: Multidetector CT imaging of the abdomen and pelvis was performed using the standard protocol following bolus administration of intravenous contrast. CONTRAST:  1 ISOVUE-300 IOPAMIDOL (ISOVUE-300) INJECTION 61% COMPARISON:  Ultrasound 10/05/2010 CT 04/01/10 FINDINGS: Lower chest: Mild atelectatic appearing patchy lung base opacities bilaterally. No effusions. Hepatobiliary: There are normal appearances of the liver, gallbladder and bile ducts. Pancreas:  There is intense inflammatory stranding around the pancreas. There is small-moderate volume peripancreatic fluid. The pancreatic parenchyma enhances uniformly. No pancreatic duct dilatation. No abscess. Spleen: Normal Adrenals/Urinary Tract: The adrenals and kidneys are normal in appearance. There is no urinary calculus evident. There is no hydronephrosis or ureteral dilatation. Collecting systems and ureters appear unremarkable. Stomach/Bowel: There are normal appearances of the stomach, small bowel and colon. The appendix is normal. Vascular/Lymphatic: The abdominal aorta is normal in caliber. There is no atherosclerotic calcification. There is no adenopathy in the abdomen or pelvis. Reproductive: Normal uterus and ovaries Other: Small  fat containing umbilical hernia Musculoskeletal: No significant skeletal lesions IMPRESSION: Acute pancreatitis with intense inflammation and small-moderate peripancreatic fluid. No evidence of pancreatic necrosis. No abscess. Electronically Signed   By: Ellery Plunkaniel R Mitchell M.D.   On: 10/16/2015 04:56    CBC  Recent Labs Lab 10/15/15 0433 10/15/15 0441 10/15/15 2034 10/16/15 0624 10/17/15 0348  WBC 6.8  --  10.3 7.6 6.8  HGB 10.8* 15.0 13.1 13.3 13.0  HCT 31.0* 44.0 38.1 38.5 37.5  PLT 238  --  308 331 346  MCV 89.3  --  87.8 90.6 89.9  MCH 31.1  --  30.2 31.3 31.2  MCHC 34.8  --  34.4 34.5 34.7  RDW 14.0  --  13.8 15.0 15.0  LYMPHSABS 1.2  --  0.9  --   --   MONOABS 0.3  --  0.6  --   --   EOSABS 0.1  --  0.0  --   --   BASOSABS 0.0  --  0.0  --   --     Chemistries   Recent Labs Lab 10/15/15 2034  10/16/15 0940 10/16/15 1153 10/16/15 1829 10/16/15 2228 10/17/15 0348  NA 131*  < > 126* 138 124* 129* 135  K 4.1  < > 4.1 3.3* 4.0 3.9 4.0  CL 99*  < > 101 110 101 107 108  CO2 11*  < > 11* 15* 13* 14* 18*  GLUCOSE 263*  < > 213* 217* 128* 175* 147*  BUN 7  < > <5* <5* <5* <5* <5*  CREATININE 0.89  < > <0.30* 0.60 0.46 0.45 0.57  CALCIUM 8.3*  <  > 6.2* 6.0* 5.5* 5.2* 5.9*  MG  --   --   --  1.4*  --   --   --   AST 20  --   --   --   --   --  39  ALT 19  --   --   --   --   --  10*  ALKPHOS 63  --   --   --   --   --  48  BILITOT 1.8*  --   --   --   --   --  1.8*  < > = values in this interval not displayed. ------------------------------------------------------------------------------------------------------------------ estimated creatinine clearance is 97.1 mL/min (by C-G formula based on Cr of 0.57). ------------------------------------------------------------------------------------------------------------------ No results for input(s): HGBA1C in the last 72 hours. ------------------------------------------------------------------------------------------------------------------  Recent Labs  10/16/15 0624  CHOL 801*  HDL NOT REPORTED DUE TO HIGH TRIGLYCERIDES  LDLCALC UNABLE TO CALCULATE IF TRIGLYCERIDE OVER 400 mg/dL  TRIG >1610>5000*  CHOLHDL NOT REPORTED DUE TO HIGH TRIGLYCERIDES   ------------------------------------------------------------------------------------------------------------------ No results for input(s): TSH, T4TOTAL, T3FREE, THYROIDAB in the last 72 hours.  Invalid input(s): FREET3 ------------------------------------------------------------------------------------------------------------------ No results for input(s): VITAMINB12, FOLATE, FERRITIN, TIBC, IRON, RETICCTPCT in the last 72 hours.  Coagulation profile No results for input(s): INR, PROTIME in the last 168 hours.  No results for input(s): DDIMER in the last 72 hours.  Cardiac Enzymes No results for input(s): CKMB, TROPONINI, MYOGLOBIN in the last 168 hours.  Invalid input(s): CK ------------------------------------------------------------------------------------------------------------------ Invalid input(s): POCBNP   Recent Labs  10/16/15 2021 10/16/15 2124 10/16/15 2243 10/16/15 2352 10/17/15 0059 10/17/15 0205  GLUCAP 136*  149* 177* 155* 164* 148*     RAI,RIPUDEEP M.D. [facility] 10/17/2015, 10:21 AM  Pager: [phone] Between 7am to 7pm - call Pager - [phone]  After 7pm go to [email or web address] -  password [email or web address]  Call night coverage person covering after 7pm

## 2015-10-17 NOTE — Progress Notes (Signed)
CRITICAL VALUE ALERT  Critical value received:  Calcium 5.9  Date of notification:  10/17/2015  Time of notification:  0548  Critical value read back:Yes.    Nurse who received alert:  Gates RiggM. Kha Hari  MD notified (1st page):  Tama GanderKatherine Schorr, NP  Time of first page:  [phone]  MD notified (2nd page):  Time of second page:  Responding MD:    Time MD responded:

## 2015-10-17 NOTE — Progress Notes (Signed)
Utilization review completed.  

## 2015-10-18 LAB — LIPID PANEL
CHOL/HDL RATIO: 17.3 ratio
Cholesterol: 294 mg/dL — ABNORMAL HIGH (ref 0–200)
HDL: 17 mg/dL — AB (ref >40)
LDL CALC: UNDETERMINED mg/dL (ref 0–99)
Triglycerides: 1018 mg/dL — ABNORMAL HIGH (ref <150)
VLDL: UNDETERMINED mg/dL (ref 0–40)

## 2015-10-18 LAB — CBC
HCT: 34.2 % — ABNORMAL LOW (ref 36.0–46.0)
Hemoglobin: 11.5 g/dL — ABNORMAL LOW (ref 12.0–15.0)
MCH: 29.8 pg (ref 26.0–34.0)
MCHC: 33.6 g/dL (ref 30.0–36.0)
MCV: 88.6 fL (ref 78.0–100.0)
Platelets: 332 10*3/uL (ref 150–400)
RBC: 3.86 MIL/uL — ABNORMAL LOW (ref 3.87–5.11)
RDW: 14.7 % (ref 11.5–15.5)
WBC: 9.3 10*3/uL (ref 4.0–10.5)

## 2015-10-18 LAB — BASIC METABOLIC PANEL
ANION GAP: 11 (ref 5–15)
CALCIUM: 5.8 mg/dL — AB (ref 8.9–10.3)
CO2: 19 mmol/L — ABNORMAL LOW (ref 22–32)
Chloride: 106 mmol/L (ref 101–111)
Creatinine, Ser: 0.44 mg/dL (ref 0.44–1.00)
GFR calc Af Amer: >60 mL/min (ref >60)
Glucose, Bld: 185 mg/dL — ABNORMAL HIGH (ref 65–99)
POTASSIUM: 2.9 mmol/L — AB (ref 3.5–5.1)
SODIUM: 136 mmol/L (ref 135–145)

## 2015-10-18 LAB — GLUCOSE, CAPILLARY
GLUCOSE-CAPILLARY: 153 mg/dL — AB (ref 65–99)
GLUCOSE-CAPILLARY: 178 mg/dL — AB (ref 65–99)
GLUCOSE-CAPILLARY: 148 mg/dL — AB (ref 65–99)
GLUCOSE-CAPILLARY: 130 mg/dL — AB (ref 65–99)
GLUCOSE-CAPILLARY: 155 mg/dL — AB (ref 65–99)
GLUCOSE-CAPILLARY: 165 mg/dL — AB (ref 65–99)
GLUCOSE-CAPILLARY: 172 mg/dL — AB (ref 65–99)
GLUCOSE-CAPILLARY: 180 mg/dL — AB (ref 65–99)
Glucose-Capillary: 108 mg/dL — ABNORMAL HIGH (ref 65–99)
Glucose-Capillary: 139 mg/dL — ABNORMAL HIGH (ref 65–99)
Glucose-Capillary: 140 mg/dL — ABNORMAL HIGH (ref 65–99)
Glucose-Capillary: 145 mg/dL — ABNORMAL HIGH (ref 65–99)
Glucose-Capillary: 151 mg/dL — ABNORMAL HIGH (ref 65–99)
Glucose-Capillary: 164 mg/dL — ABNORMAL HIGH (ref 65–99)
Glucose-Capillary: 167 mg/dL — ABNORMAL HIGH (ref 65–99)
Glucose-Capillary: 168 mg/dL — ABNORMAL HIGH (ref 65–99)
Glucose-Capillary: 168 mg/dL — ABNORMAL HIGH (ref 65–99)
Glucose-Capillary: 169 mg/dL — ABNORMAL HIGH (ref 65–99)
Glucose-Capillary: 172 mg/dL — ABNORMAL HIGH (ref 65–99)
Glucose-Capillary: 184 mg/dL — ABNORMAL HIGH (ref 65–99)

## 2015-10-18 LAB — MAGNESIUM: MAGNESIUM: 2.3 mg/dL (ref 1.7–2.4)

## 2015-10-18 LAB — LIPASE, BLOOD: LIPASE: 101 U/L — AB (ref 11–51)

## 2015-10-18 MED ORDER — POTASSIUM CHLORIDE 10 MEQ/100ML IV SOLN
10.0000 meq | INTRAVENOUS | Status: AC
  Administered 2015-10-18 (×3): 10 meq via INTRAVENOUS

## 2015-10-18 MED ORDER — POTASSIUM CHLORIDE 10 MEQ/100ML IV SOLN
10.0000 meq | INTRAVENOUS | Status: DC
  Administered 2015-10-18 (×2): 10 meq via INTRAVENOUS
  Filled 2015-10-18 (×4): qty 100

## 2015-10-18 MED ORDER — POLYETHYLENE GLYCOL 3350 17 G PO PACK: 17.0000 g | PACK | Freq: Every day | ORAL | Status: DC | PRN

## 2015-10-18 MED ORDER — SENNOSIDES-DOCUSATE SODIUM 8.6-50 MG PO TABS
1.0000 | ORAL_TABLET | Freq: Two times a day (BID) | ORAL | Status: DC
  Administered 2015-10-18 – 2015-10-21 (×4): 1 via ORAL
  Filled 2015-10-18 (×5): qty 1

## 2015-10-18 MED ORDER — POTASSIUM CHLORIDE 2 MEQ/ML IV SOLN
INTRAVENOUS | Status: DC
  Administered 2015-10-18 – 2015-10-21 (×8): via INTRAVENOUS
  Filled 2015-10-18 (×12): qty 1000

## 2015-10-18 MED ORDER — POTASSIUM CHLORIDE CRYS ER 20 MEQ PO TBCR
40.0000 meq | EXTENDED_RELEASE_TABLET | Freq: Two times a day (BID) | ORAL | Status: DC
  Administered 2015-10-18 – 2015-10-20 (×6): 40 meq via ORAL
  Filled 2015-10-18 (×6): qty 2

## 2015-10-18 NOTE — Progress Notes (Addendum)
Inpatient Diabetes Program Recommendations  AACE/ADA: New Consensus Statement on Inpatient Glycemic Control (2015)  Target Ranges:  Prepandial:   less than 140 mg/dL      Peak postprandial:   less than 180 mg/dL (1-2 hours)      Critically ill patients:  140 - 180 mg/dL   Review of Glycemic Control  Diabetes history: DM 2 Outpatient Diabetes medications: Metformin 500 mg QAM, 1,000 mg QPM Current orders for Inpatient glycemic control: Insulin gtt  Inpatient Diabetes Program Recommendations: Insulin - IV drip/GlucoStabilizer: Consider continuing IV insulin even though glucose and anion gap is within normal range due to high triglycerides. Consider ordering another triglyceride level. IV insulin may be beneficial in reducing triglyceride levels in pancreatitis.  HgbA1C: Consider ordering A1c to assess glucose control over the past 2-3 months.  Thanks,  Christena DeemShannon Elizeth Weinrich RN, MSN, [facility] Inpatient Diabetes Coordinator Team Pager [phone] (8a-5p)

## 2015-10-18 NOTE — Progress Notes (Signed)
Inpatient Diabetes Program Recommendations  AACE/ADA: New Consensus Statement on Inpatient Glycemic Control (2015)  Target Ranges:  Prepandial:   less than 140 mg/dL      Peak postprandial:   less than 180 mg/dL (1-2 hours)      Critically ill patients:  140 - 180 mg/dL   Spoke with patient about diabetes and home regimen for diabetes control. Patient reports that she is followed by Dr. Vassie LollAlva for diabetes management and currently only takes metformin. Discussed with pt about her possibly not being on Metformin at time of d/c but on insulin. Patient is agreeable to this. Patient reports that her last A1c was in January and was >9% at that time. She mentioned she has lowered her glucose by exercising and dieting along with her metformin at one time. Patient states she can do it again. Spoke with RN about teaching patient insulin when appropriate when SQ insulin is ordered. Patient states she has an appointment with Dr. Vassie LollAlva within 2 weeks. Patient has no other concerns at this time.   Thanks, Christena DeemShannon Danitza Schoenfeldt RN, MSN, [facility] Inpatient Diabetes Coordinator Team Pager [phone] (8a-5p)

## 2015-10-18 NOTE — Progress Notes (Signed)
CRITICAL VALUE ALERT  Critical value received:  Calcium 5.8  Date of notification:  10/18/2015  Time of notification:  0532  Critical value read back:Yes.    Nurse who received alert:  Gates RiggM. Langston Summerfield, RN  MD notified (1st page):  [facility], NP  Time of first page:  0533  MD notified (2nd page):  Time of second page:  Responding MD:    Time MD responded:

## 2015-10-18 NOTE — Progress Notes (Signed)
[facility]                                                                              Patient Demographics  [name], is a 50 y.o. female, DOB - February 10, 1966, [id number]  Admit date - 10/15/2015   Admitting Physician Michael Litter, MD  Outpatient Primary MD for the patient is Hoyle Sauer, MD  LOS - 3  days    Chief Complaint  Patient presents with  . Abdominal Pain       Brief HPI   [name] is a 50 y.o. woman with a history of Type 2 DM (level of control unknown; treated with metformin), GERD, and recurrent pancreatitis (2010, 2012, 2014; etiology unclear) who had presented earlier today for evaluation of abdominal pain. She had a mild elevation of her lipase at that time (204), but she seemed to improved with anti-emetics and analgesics and was discharged to home with plans to follow-up with outpatient providers. Unfortunately, the patient had an escalation in her pain symptoms after passing her oral challenge in the ED. She describes a throbbing left-sided pain that radiates to her epigastric area, 7-8 out of 10 in intensity at its worst. She has had three episodes of diarrhea.She returned to the ED tonight and was found to have a lipase level that has increased to 942. She also has a bicarb level of 11 and an anion gap of 21, +ketones on her U/A.   Assessment & Plan    Acute recurrent pancreatitis: Likely due to severe hypertriglyceridemia - CT abdomen consistent with acute pancreatitis - Lipid panel showed triglycerides over 5000, Continue TriCor - Continue NPO today, aggressive IV fluid hydration, bowel rest - Feeling slightly better today, Repeat lipid panel   Hyponatremia, hypocalcemia - Likely due to profound dehydration, acute pancreatitis and DKA - Continue IV fluid hydration, IV calcium replacement BID   Severe Hypertriglyceridemia - Placed on Tricor, will also place on low-dose statin at discharge. Repeat lipid panel  today - Rule out familial hypertriglyceridemia, recommended patient to follow with endocrinology outpatient  DKA, anion gap of 21 with bicarb of 11 and ketones in urine - Gap has closed bicarbonate 19, however patient has severe pancreatitis and NPO - will continue IV fluids and IV insulin glucose stabilizer - Continue to hold metformin - Diabetic coordinator consult and nutrition consult once back on diet, patient is motivated to continue insulin at the time of discharge.  Hypokalemia, hypocalcemia - Replaced IV  Code Status: full code   Family Communication: Discussed in detail with the patient, all imaging results, lab results explained to the patient    Disposition Plan: keep in sdu today, will transfer to regular floor once off the insulin drip  Time Spent in minutes  25 minutes  Procedures  CT abd  Consults   None   DVT Prophylaxis   SCD's  Medications  Scheduled Meds: . calcium gluconate  1 g Intravenous BID  . fenofibrate  160 mg Oral Daily  . fluticasone  1 spray Each Nare QHS  . pantoprazole (PROTONIX) IV  40 mg Intravenous Q24H  . potassium chloride  10 mEq  Intravenous Q1 Hr x 3  . potassium chloride  40 mEq Oral BID  . prochlorperazine  10 mg Intravenous Once  . sodium chloride flush  3 mL Intravenous Q12H   Continuous Infusions: . sodium chloride Stopped (10/17/15 0730)  . dextrose 5 % and 0.45% NaCl 1,000 mL with potassium chloride 40 mEq infusion 125 mL/hr at 10/18/15 0930  . insulin (NOVOLIN-R) infusion 2.6 Units/hr (10/18/15 0920)   PRN Meds:.acetaminophen **OR** acetaminophen, HYDROmorphone (DILAUDID) injection, LORazepam, ondansetron **OR** ondansetron (ZOFRAN) IV, oxyCODONE   Antibiotics   Anti-infectives    None        Subjective:   [name] was seen and examined today. Abdominal pain improving, otherwise no fevers, chills, chest pain, shortness of breath, vomiting, diarrhea. No acute events overnight.     Objective:   Filed  Vitals:   10/18/15 0000 10/18/15 0400 10/18/15 0530 10/18/15 0731  BP: 118/83 131/79  98/65  Pulse: 105 116 108 104  Temp: 98.1 F (36.7 C) 98.5 F (36.9 C)  98.5 F (36.9 C)  TempSrc: Oral Oral  Oral  Resp: 16 22 11 20   Height:      Weight:      SpO2: 95% 93% 96% 93%    Intake/Output Summary (Last 24 hours) at 10/18/15 1004 Last data filed at 10/18/15 0755  Gross per 24 hour  Intake 2757.78 ml  Output    250 ml  Net 2507.78 ml     Wt Readings from Last 3 Encounters:  10/16/15 95.301 kg (210 lb 1.6 oz)  08/06/13 90.538 kg (199 lb 9.6 oz)  08/02/13 91.989 kg (202 lb 12.8 oz)     Exam  General: Alert and oriented x 3 , NAD sitting at the edge of the bed  HEENT:    Neck:   CVS: S1 S2 clear, RRR  Respiratory: CTAB, no wheezing   Abdomen: Morbidly obese, Soft, diffuse TTP, worse in epigastric region-improving   Ext: no cyanosis clubbing or edema  Neuro: moving all 4 extremities  Skin: No rashes  Psych: anxious   Data Reviewed:  I have personally reviewed following labs and imaging studies  Micro Results Recent Results (from the past 240 hour(s))  MRSA PCR Screening     Status: None   Collection Time: 10/16/15 10:34 AM  Result Value Ref Range Status   MRSA by PCR NEGATIVE NEGATIVE Final    Comment:        The GeneXpert MRSA Assay (FDA approved for NASAL specimens only), is one component of a comprehensive MRSA colonization surveillance program. It is not intended to diagnose MRSA infection nor to guide or monitor treatment for MRSA infections.     Radiology Reports Ct Abdomen Pelvis W Contrast  10/16/2015  CLINICAL DATA:  Left lower quadrant abdominal pain EXAM: CT ABDOMEN AND PELVIS WITH CONTRAST TECHNIQUE: Multidetector CT imaging of the abdomen and pelvis was performed using the standard protocol following bolus administration of intravenous contrast. CONTRAST:  1 ISOVUE-300 IOPAMIDOL (ISOVUE-300) INJECTION 61% COMPARISON:  Ultrasound  10/05/2010 CT 04/01/10 FINDINGS: Lower chest: Mild atelectatic appearing patchy lung base opacities bilaterally. No effusions. Hepatobiliary: There are normal appearances of the liver, gallbladder and bile ducts. Pancreas: There is intense inflammatory stranding around the pancreas. There is small-moderate volume peripancreatic fluid. The pancreatic parenchyma enhances uniformly. No pancreatic duct dilatation. No abscess. Spleen: Normal Adrenals/Urinary Tract: The adrenals and kidneys are normal in appearance. There is no urinary calculus evident. There is no hydronephrosis or ureteral dilatation. Collecting systems and  ureters appear unremarkable. Stomach/Bowel: There are normal appearances of the stomach, small bowel and colon. The appendix is normal. Vascular/Lymphatic: The abdominal aorta is normal in caliber. There is no atherosclerotic calcification. There is no adenopathy in the abdomen or pelvis. Reproductive: Normal uterus and ovaries Other: Small fat containing umbilical hernia Musculoskeletal: No significant skeletal lesions IMPRESSION: Acute pancreatitis with intense inflammation and small-moderate peripancreatic fluid. No evidence of pancreatic necrosis. No abscess. Electronically Signed   By: Ellery Plunkaniel R Mitchell M.D.   On: 10/16/2015 04:56    CBC  Recent Labs Lab 10/15/15 0433 10/15/15 0441 10/15/15 2034 10/16/15 0624 10/17/15 0348 10/18/15 0422  WBC 6.8  --  10.3 7.6 6.8 9.3  HGB 10.8* 15.0 13.1 13.3 13.0 11.5*  HCT 31.0* 44.0 38.1 38.5 37.5 34.2*  PLT 238  --  308 331 346 332  MCV 89.3  --  87.8 90.6 89.9 88.6  MCH 31.1  --  30.2 31.3 31.2 29.8  MCHC 34.8  --  34.4 34.5 34.7 33.6  RDW 14.0  --  13.8 15.0 15.0 14.7  LYMPHSABS 1.2  --  0.9  --   --   --   MONOABS 0.3  --  0.6  --   --   --   EOSABS 0.1  --  0.0  --   --   --   BASOSABS 0.0  --  0.0  --   --   --     Chemistries   Recent Labs Lab 10/15/15 2034  10/16/15 1153 10/16/15 1829 10/16/15 2228 10/17/15 0348  10/18/15 0422  NA 131*  < > 138 124* 129* 135 136  K 4.1  < > 3.3* 4.0 3.9 4.0 2.9*  CL 99*  < > 110 101 107 108 106  CO2 11*  < > 15* 13* 14* 18* 19*  GLUCOSE 263*  < > 217* 128* 175* 147* 185*  BUN 7  < > <5* <5* <5* <5* <5*  CREATININE 0.89  < > 0.60 0.46 0.45 0.57 0.44  CALCIUM 8.3*  < > 6.0* 5.5* 5.2* 5.9* 5.8*  MG  --   --  1.4*  --   --   --  2.3  AST 20  --   --   --   --  39  --   ALT 19  --   --   --   --  10*  --   ALKPHOS 63  --   --   --   --  48  --   BILITOT 1.8*  --   --   --   --  1.8*  --   < > = values in this interval not displayed. ------------------------------------------------------------------------------------------------------------------ estimated creatinine clearance is 97.1 mL/min (by C-G formula based on Cr of 0.44). ------------------------------------------------------------------------------------------------------------------ No results for input(s): HGBA1C in the last 72 hours. ------------------------------------------------------------------------------------------------------------------  Recent Labs  10/16/15 0624  CHOL 801*  HDL NOT REPORTED DUE TO HIGH TRIGLYCERIDES  LDLCALC UNABLE TO CALCULATE IF TRIGLYCERIDE OVER 400 mg/dL  TRIG >9604>5000*  CHOLHDL NOT REPORTED DUE TO HIGH TRIGLYCERIDES   ------------------------------------------------------------------------------------------------------------------ No results for input(s): TSH, T4TOTAL, T3FREE, THYROIDAB in the last 72 hours.  Invalid input(s): FREET3 ------------------------------------------------------------------------------------------------------------------ No results for input(s): VITAMINB12, FOLATE, FERRITIN, TIBC, IRON, RETICCTPCT in the last 72 hours.  Coagulation profile No results for input(s): INR, PROTIME in the last 168 hours.  No results for input(s): DDIMER in the last 72 hours.  Cardiac Enzymes No results for input(s): CKMB, TROPONINI, MYOGLOBIN  in the last 168  hours.  Invalid input(s): CK ------------------------------------------------------------------------------------------------------------------ Invalid input(s): POCBNP   Recent Labs  10/18/15 0141 10/18/15 0252 10/18/15 0350 10/18/15 0512 10/18/15 0616 10/18/15 0752  GLUCAP 153* 164* 184* 178* 169* 139*     Desirai Traxler M.D. [facility] 10/18/2015, 10:04 AM  Pager: [phone] Between 7am to 7pm - call Pager - [phone]  After 7pm go to [email or web address] - password [email or web address]  Call night coverage person covering after 7pm

## 2015-10-19 LAB — GLUCOSE, CAPILLARY
GLUCOSE-CAPILLARY: 149 mg/dL — AB (ref 65–99)
GLUCOSE-CAPILLARY: 154 mg/dL — AB (ref 65–99)
GLUCOSE-CAPILLARY: 155 mg/dL — AB (ref 65–99)
GLUCOSE-CAPILLARY: 157 mg/dL — AB (ref 65–99)
GLUCOSE-CAPILLARY: 161 mg/dL — AB (ref 65–99)
GLUCOSE-CAPILLARY: 163 mg/dL — AB (ref 65–99)
GLUCOSE-CAPILLARY: 171 mg/dL — AB (ref 65–99)
GLUCOSE-CAPILLARY: 172 mg/dL — AB (ref 65–99)
GLUCOSE-CAPILLARY: 174 mg/dL — AB (ref 65–99)
GLUCOSE-CAPILLARY: 190 mg/dL — AB (ref 65–99)
GLUCOSE-CAPILLARY: 198 mg/dL — AB (ref 65–99)
Glucose-Capillary: 119 mg/dL — ABNORMAL HIGH (ref 65–99)
Glucose-Capillary: 138 mg/dL — ABNORMAL HIGH (ref 65–99)
Glucose-Capillary: 143 mg/dL — ABNORMAL HIGH (ref 65–99)
Glucose-Capillary: 147 mg/dL — ABNORMAL HIGH (ref 65–99)
Glucose-Capillary: 155 mg/dL — ABNORMAL HIGH (ref 65–99)
Glucose-Capillary: 162 mg/dL — ABNORMAL HIGH (ref 65–99)
Glucose-Capillary: 162 mg/dL — ABNORMAL HIGH (ref 65–99)
Glucose-Capillary: 163 mg/dL — ABNORMAL HIGH (ref 65–99)
Glucose-Capillary: 163 mg/dL — ABNORMAL HIGH (ref 65–99)
Glucose-Capillary: 175 mg/dL — ABNORMAL HIGH (ref 65–99)
Glucose-Capillary: 177 mg/dL — ABNORMAL HIGH (ref 65–99)
Glucose-Capillary: 186 mg/dL — ABNORMAL HIGH (ref 65–99)

## 2015-10-19 LAB — BASIC METABOLIC PANEL
ANION GAP: 10 (ref 5–15)
BUN: <5 mg/dL — ABNORMAL LOW (ref 6–20)
CALCIUM: 6.5 mg/dL — AB (ref 8.9–10.3)
CO2: 21 mmol/L — AB (ref 22–32)
CREATININE: 0.55 mg/dL (ref 0.44–1.00)
Chloride: 108 mmol/L (ref 101–111)
GLUCOSE: 200 mg/dL — AB (ref 65–99)
Potassium: 4.1 mmol/L (ref 3.5–5.1)
Sodium: 139 mmol/L (ref 135–145)

## 2015-10-19 LAB — URINALYSIS, ROUTINE W REFLEX MICROSCOPIC
Bilirubin Urine: NEGATIVE
Glucose, UA: >1000 mg/dL — AB
Ketones, ur: 15 mg/dL — AB
Leukocytes, UA: NEGATIVE
Nitrite: NEGATIVE
Protein, ur: NEGATIVE mg/dL
Specific Gravity, Urine: 1.023 (ref 1.005–1.030)
pH: 5.5 (ref 5.0–8.0)

## 2015-10-19 LAB — HEMOGLOBIN A1C
HEMOGLOBIN A1C: 10.6 % — AB (ref 4.8–5.6)
Mean Plasma Glucose: 258 mg/dL

## 2015-10-19 LAB — URINE MICROSCOPIC-ADD ON: WBC, UA: NONE SEEN WBC/hpf (ref 0–5)

## 2015-10-19 LAB — CBC
HCT: 34.4 % — ABNORMAL LOW (ref 36.0–46.0)
Hemoglobin: 11 g/dL — ABNORMAL LOW (ref 12.0–15.0)
MCH: 28.9 pg (ref 26.0–34.0)
MCHC: 32 g/dL (ref 30.0–36.0)
MCV: 90.5 fL (ref 78.0–100.0)
PLATELETS: 364 10*3/uL (ref 150–400)
RBC: 3.8 MIL/uL — AB (ref 3.87–5.11)
RDW: 15 % (ref 11.5–15.5)
WBC: 10.5 10*3/uL (ref 4.0–10.5)

## 2015-10-19 LAB — AMMONIA: Ammonia: 22 umol/L (ref 9–35)

## 2015-10-19 LAB — LIPASE, BLOOD: LIPASE: 67 U/L — AB (ref 11–51)

## 2015-10-19 MED ORDER — INSULIN STARTER KIT- PEN NEEDLES (ENGLISH)
1.0000 | Freq: Once | Status: AC
  Administered 2015-10-19: 1
  Filled 2015-10-19: qty 1

## 2015-10-19 MED ORDER — VITAMIN B-1 100 MG PO TABS
100.0000 mg | ORAL_TABLET | Freq: Every day | ORAL | Status: DC
  Administered 2015-10-20 – 2015-10-26 (×7): 100 mg via ORAL
  Filled 2015-10-19 (×7): qty 1

## 2015-10-19 MED ORDER — LIVING WELL WITH DIABETES BOOK
Freq: Once | Status: AC
  Administered 2015-10-19: 12:00:00
  Filled 2015-10-19: qty 1

## 2015-10-19 MED ORDER — OXYCODONE-ACETAMINOPHEN 5-325 MG PO TABS: 1.0000 | ORAL_TABLET | ORAL | Status: DC | PRN

## 2015-10-19 MED ORDER — LORAZEPAM 1 MG PO TABS: 1.0000 mg | ORAL_TABLET | Freq: Four times a day (QID) | ORAL | Status: DC | PRN

## 2015-10-19 MED ORDER — LORAZEPAM 1 MG PO TABS
1.0000 mg | ORAL_TABLET | Freq: Once | ORAL | Status: AC
  Administered 2015-10-19: 1 mg via ORAL
  Filled 2015-10-19: qty 1

## 2015-10-19 MED ORDER — FOLIC ACID 1 MG PO TABS
1.0000 mg | ORAL_TABLET | Freq: Every day | ORAL | Status: DC
  Administered 2015-10-20 – 2015-10-26 (×7): 1 mg via ORAL
  Filled 2015-10-19 (×7): qty 1

## 2015-10-19 MED ORDER — OXYCODONE HCL 5 MG PO TABS: 5.0000 mg | ORAL_TABLET | ORAL | Status: DC | PRN

## 2015-10-19 MED ORDER — LORAZEPAM 2 MG/ML IJ SOLN
1.0000 mg | Freq: Once | INTRAMUSCULAR | Status: AC
  Administered 2015-10-19: 1 mg via INTRAVENOUS
  Filled 2015-10-19: qty 1

## 2015-10-19 MED ORDER — HYDROCODONE-ACETAMINOPHEN 5-325 MG PO TABS: 1.0000 | ORAL_TABLET | ORAL | Status: DC | PRN

## 2015-10-19 MED ORDER — FENTANYL CITRATE (PF) 100 MCG/2ML IJ SOLN
12.5000 ug | INTRAMUSCULAR | Status: DC | PRN
  Administered 2015-10-19 – 2015-10-21 (×9): 25 ug via INTRAVENOUS
  Filled 2015-10-19 (×8): qty 2

## 2015-10-19 MED ORDER — ADULT MULTIVITAMIN W/MINERALS CH
1.0000 | ORAL_TABLET | Freq: Every day | ORAL | Status: DC
  Administered 2015-10-20 – 2015-10-26 (×7): 1 via ORAL
  Filled 2015-10-19 (×7): qty 1

## 2015-10-19 MED ORDER — LORAZEPAM 2 MG/ML IJ SOLN
1.0000 mg | Freq: Once | INTRAMUSCULAR | Status: AC
  Administered 2015-10-19: 1 mg via INTRAVENOUS

## 2015-10-19 MED ORDER — THIAMINE HCL 100 MG/ML IJ SOLN
100.0000 mg | Freq: Every day | INTRAMUSCULAR | Status: DC
  Filled 2015-10-19: qty 2

## 2015-10-19 MED ORDER — LORAZEPAM 2 MG/ML IJ SOLN
1.0000 mg | Freq: Four times a day (QID) | INTRAMUSCULAR | Status: DC | PRN
  Administered 2015-10-19 – 2015-10-20 (×2): 1 mg via INTRAVENOUS
  Filled 2015-10-19 (×3): qty 1

## 2015-10-19 MED ORDER — FENTANYL CITRATE (PF) 100 MCG/2ML IJ SOLN: 25.0000 ug | INTRAMUSCULAR | Status: DC | PRN

## 2015-10-19 NOTE — Progress Notes (Signed)
         [facility]                                                                              Patient Demographics  [name], is a 50 y.o. female, DOB - 04/15/1966, MRN:[id number]  Admit date - 10/15/2015   Admitting Physician Nikki Carter, MD  Outpatient Primary MD for the patient is AVVA,RAVISANKAR R, MD  LOS - 4  days    Chief Complaint  Patient presents with  . Abdominal Pain       Brief HPI   [name] is a 50 y.o. woman with a history of Type 2 DM (level of control unknown; treated with metformin), GERD, and recurrent pancreatitis (2010, 2012, 2014; etiology unclear) who had presented earlier today for evaluation of abdominal pain. She had a mild elevation of her lipase at that time (204), but she seemed to improved with anti-emetics and analgesics and was discharged to home with plans to follow-up with outpatient providers. Unfortunately, the patient had an escalation in her pain symptoms after passing her oral challenge in the ED. She describes a throbbing left-sided pain that radiates to her epigastric area, 7-8 out of 10 in intensity at its worst. She has had three episodes of diarrhea.She returned to the ED tonight and was found to have a lipase level that has increased to 942. She also has a bicarb level of 11 and an anion gap of 21, +ketones on her U/A.   Assessment & Plan    Acute recurrent pancreatitis: Likely due to severe hypertriglyceridemia - CT abdomen consistent with acute pancreatitis - Lipid panel showed triglycerides over 5000, Continue TriCor - Continue NPO For 1 more day, will start on clears tomorrow, continue IV fluid hydration, bowel rest - Lipids panel showed improving triglycerides to 1018  Acute encephalopathy unclear possibly due to narcotics -Overnight patient became very delirious, Pulling her IVs and agitated, sitter was placed - Discussed in detail with the patient's husband, he confirms that she does not drink and they  have no alcohol drinks in the house. Hence alcohol withdrawal less likely. Patient's husband strongly thinks it's because of her pain medications, patient does not take narcotics at home -  will discontinue Dilaudid and oxycodone. Placed on fentanyl and hydrocodone at lower doses, keep sitter in the room today  Hyponatremia, hypocalcemia: Improving - Likely due to profound dehydration, acute pancreatitis and DKA - Continue IV fluid hydration, IV calcium replacement BID   Severe Hypertriglyceridemia - Placed on Tricor, will also place on low-dose statin at discharge.  - Rule out familial hypertriglyceridemia, recommended patient to follow with endocrinology outpatient  DKA, anion gap of 21 with bicarb of 11 and ketones in urine - Gap has closed bicarbonate 21, however patient has severe pancreatitis and NPO - will continue IV fluids and IV insulin glucose stabilizer today, will transition to subcutaneous insulin tomorrow after starting on diet - Continue to hold metformin  Code Status: full code   Family Communication: Discussed in detail with the patient, all imaging results, lab results explained to the patient 's husband In detail   Disposition Plan: keep in sdu today, will transfer to   regular floor once off the insulin drip and mental status improving tomorrow  Time Spent in minutes  25 minutes  Procedures  CT abd  Consults   None   DVT Prophylaxis   SCD's  Medications  Scheduled Meds: . calcium gluconate  1 g Intravenous BID  . fenofibrate  160 mg Oral Daily  . fluticasone  1 spray Each Nare QHS  . insulin starter kit- pen needles  1 kit Other Once  . living well with diabetes book   Does not apply Once  . pantoprazole (PROTONIX) IV  40 mg Intravenous Q24H  . potassium chloride  40 mEq Oral BID  . prochlorperazine  10 mg Intravenous Once  . senna-docusate  1 tablet Oral BID  . sodium chloride flush  3 mL Intravenous Q12H   Continuous Infusions: . sodium chloride  Stopped (10/17/15 0730)  . dextrose 5 % and 0.45% NaCl 1,000 mL with potassium chloride 40 mEq infusion 125 mL/hr at 10/19/15 0700  . insulin (NOVOLIN-R) infusion 3 Units/hr (10/19/15 0934)   PRN Meds:.acetaminophen **OR** acetaminophen, fentaNYL (SUBLIMAZE) injection, HYDROcodone-acetaminophen, LORazepam, ondansetron **OR** ondansetron (ZOFRAN) IV, polyethylene glycol   Antibiotics   Anti-infectives    None        Subjective:   Arbutus Deridder was seen and examined today.Sitter in the room, patient currently alert and awake. Overnight events noted.  Abdominal pain improving, otherwise no fevers, chills, chest pain, shortness of breath, vomiting, diarrhea. No acute events overnight.     Objective:   Filed Vitals:   10/18/15 1600 10/18/15 2040 10/18/15 2323 10/19/15 0340  BP: 131/99 116/66 117/68 108/64  Pulse: 121 109 115 111  Temp: 97.5 F (36.4 C) 98.2 F (36.8 C) 98.5 F (36.9 C) 98.7 F (37.1 C)  TempSrc: Oral Oral Oral Oral  Resp: 25 19 20 21  Height:      Weight:      SpO2: 93% 92% 96% 95%    Intake/Output Summary (Last 24 hours) at 10/19/15 1056 Last data filed at 10/19/15 1024  Gross per 24 hour  Intake 3369.54 ml  Output   1025 ml  Net 2344.54 ml     Wt Readings from Last 3 Encounters:  10/16/15 95.301 kg (210 lb 1.6 oz)  08/06/13 90.538 kg (199 lb 9.6 oz)  08/02/13 91.989 kg (202 lb 12.8 oz)     Exam  General: Alert and oriented x 3 , NAD  HEENT:    Neck:   CVS: S1 S2 clear, RRR  Respiratory: CTAB, no wheezing   Abdomen: Morbidly obese, Soft, diffuse TTP, worse in epigastric region-improving   Ext: no cyanosis clubbing or edema  Neuro: moving all 4 extremities  Skin: No rashes  Psych: anxious   Data Reviewed:  I have personally reviewed following labs and imaging studies  Micro Results Recent Results (from the past 240 hour(s))  MRSA PCR Screening     Status: None   Collection Time: 10/16/15 10:34 AM  Result Value Ref Range  Status   MRSA by PCR NEGATIVE NEGATIVE Final    Comment:        The GeneXpert MRSA Assay (FDA approved for NASAL specimens only), is one component of a comprehensive MRSA colonization surveillance program. It is not intended to diagnose MRSA infection nor to guide or monitor treatment for MRSA infections.     Radiology Reports Ct Abdomen Pelvis W Contrast  10/16/2015  CLINICAL DATA:  Left lower quadrant abdominal pain EXAM: CT ABDOMEN AND PELVIS WITH CONTRAST   TECHNIQUE: Multidetector CT imaging of the abdomen and pelvis was performed using the standard protocol following bolus administration of intravenous contrast. CONTRAST:  1 ISOVUE-300 IOPAMIDOL (ISOVUE-300) INJECTION 61% COMPARISON:  Ultrasound 10/05/2010 CT 04/01/10 FINDINGS: Lower chest: Mild atelectatic appearing patchy lung base opacities bilaterally. No effusions. Hepatobiliary: There are normal appearances of the liver, gallbladder and bile ducts. Pancreas: There is intense inflammatory stranding around the pancreas. There is small-moderate volume peripancreatic fluid. The pancreatic parenchyma enhances uniformly. No pancreatic duct dilatation. No abscess. Spleen: Normal Adrenals/Urinary Tract: The adrenals and kidneys are normal in appearance. There is no urinary calculus evident. There is no hydronephrosis or ureteral dilatation. Collecting systems and ureters appear unremarkable. Stomach/Bowel: There are normal appearances of the stomach, small bowel and colon. The appendix is normal. Vascular/Lymphatic: The abdominal aorta is normal in caliber. There is no atherosclerotic calcification. There is no adenopathy in the abdomen or pelvis. Reproductive: Normal uterus and ovaries Other: Small fat containing umbilical hernia Musculoskeletal: No significant skeletal lesions IMPRESSION: Acute pancreatitis with intense inflammation and small-moderate peripancreatic fluid. No evidence of pancreatic necrosis. No abscess. Electronically Signed    By: Daniel R Mitchell M.D.   On: 10/16/2015 04:56    CBC  Recent Labs Lab 10/15/15 0433  10/15/15 2034 10/16/15 0624 10/17/15 0348 10/18/15 0422 10/19/15 0511  WBC 6.8  --  10.3 7.6 6.8 9.3 10.5  HGB 10.8*  < > 13.1 13.3 13.0 11.5* 11.0*  HCT 31.0*  < > 38.1 38.5 37.5 34.2* 34.4*  PLT 238  --  308 331 346 332 364  MCV 89.3  --  87.8 90.6 89.9 88.6 90.5  MCH 31.1  --  30.2 31.3 31.2 29.8 28.9  MCHC 34.8  --  34.4 34.5 34.7 33.6 32.0  RDW 14.0  --  13.8 15.0 15.0 14.7 15.0  LYMPHSABS 1.2  --  0.9  --   --   --   --   MONOABS 0.3  --  0.6  --   --   --   --   EOSABS 0.1  --  0.0  --   --   --   --   BASOSABS 0.0  --  0.0  --   --   --   --   < > = values in this interval not displayed.  Chemistries   Recent Labs Lab 10/15/15 2034  10/16/15 1153 10/16/15 1829 10/16/15 2228 10/17/15 0348 10/18/15 0422 10/19/15 0511  NA 131*  < > 138 124* 129* 135 136 139  K 4.1  < > 3.3* 4.0 3.9 4.0 2.9* 4.1  CL 99*  < > 110 101 107 108 106 108  CO2 11*  < > 15* 13* 14* 18* 19* 21*  GLUCOSE 263*  < > 217* 128* 175* 147* 185* 200*  BUN 7  < > <5* <5* <5* <5* <5* <5*  CREATININE 0.89  < > 0.60 0.46 0.45 0.57 0.44 0.55  CALCIUM 8.3*  < > 6.0* 5.5* 5.2* 5.9* 5.8* 6.5*  MG  --   --  1.4*  --   --   --  2.3  --   AST 20  --   --   --   --  39  --   --   ALT 19  --   --   --   --  10*  --   --   ALKPHOS 63  --   --   --   --  48  --   --     BILITOT 1.8*  --   --   --   --  1.8*  --   --   < > = values in this interval not displayed. ------------------------------------------------------------------------------------------------------------------ estimated creatinine clearance is 97.1 mL/min (by C-G formula based on Cr of 0.55). ------------------------------------------------------------------------------------------------------------------  Recent Labs  10/18/15 1159  HGBA1C 10.6*    ------------------------------------------------------------------------------------------------------------------  Recent Labs  10/18/15 1159  CHOL 294*  HDL 17*  LDLCALC UNABLE TO CALCULATE IF TRIGLYCERIDE OVER 400 mg/dL  TRIG 1018*  CHOLHDL 17.3   ------------------------------------------------------------------------------------------------------------------ No results for input(s): TSH, T4TOTAL, T3FREE, THYROIDAB in the last 72 hours.  Invalid input(s): FREET3 ------------------------------------------------------------------------------------------------------------------ No results for input(s): VITAMINB12, FOLATE, FERRITIN, TIBC, IRON, RETICCTPCT in the last 72 hours.  Coagulation profile No results for input(s): INR, PROTIME in the last 168 hours.  No results for input(s): DDIMER in the last 72 hours.  Cardiac Enzymes No results for input(s): CKMB, TROPONINI, MYOGLOBIN in the last 168 hours.  Invalid input(s): CK ------------------------------------------------------------------------------------------------------------------ Invalid input(s): [place]  10/19/15 0530 10/19/15 0634 10/19/15 0734 10/19/15 0828 10/19/15 0945 10/19/15 1032  GLUCAP 186* 163* 138* 154* 78* 155*     RAI,RIPUDEEP M.D. [facility] 10/19/2015, 10:56 AM  Pager: [phone] Between 7am to 7pm - call Pager - [phone]  After 7pm go to [email or web address] - password [email or web address]  Call night coverage person covering after 7pm

## 2015-10-19 NOTE — Progress Notes (Signed)
Staff responded to a bed alarm. Pt said she needed to use the bathroom and [facility] Pt started panicing and pulling off telemetry leads, blood pressure cuff, and IV. Pt was screaming, "get away from me", and ran out of the unit into the hallway near the elevators. Security was promptly called. In the hallway, she was pushing and yelling at [employer] staff members, a [employer] risk surveyor[employer] employee, and a random pedestrian in the hall. The patient kept referring to a "bomb" that was in her room and refused to go back. With security and the charge nurse, we convinced to reenter her room. Pt requested to talk to the doctor. Schorr, NP talked with patient over the phone. Medications were given (see MAR), and patient allowed staff to restart treatment (replace telemetry leads, restart IV, etc.). Patient called her husband with her personal cell phone at bedside, and he came to see her at bedside approximately 30 minutes after the patient called him. Husband pulled me aside to mention her mental state was not her baseline and wondered if she could be reacting to any medications. RN requested he talk to the MD during morning rounds. Husband had to leave to return to their child.  Currently, pt is resting with NT safety sitter at bedside. Will continue to monitor closely.

## 2015-10-19 NOTE — Progress Notes (Addendum)
Inpatient Diabetes Program Recommendations  AACE/ADA: New Consensus Statement on Inpatient Glycemic Control (2015)  Target Ranges:  Prepandial:   less than 140 mg/dL      Peak postprandial:   less than 180 mg/dL (1-2 hours)      Critically ill patients:  140 - 180 mg/dL  Results for [name], [name] (MRN [id number]) as of 10/19/2015 10:16  Ref. Range 10/19/2015 00:26 10/19/2015 01:24 10/19/2015 02:34 10/19/2015 03:37 10/19/2015 04:37 10/19/2015 05:30 10/19/2015 06:34 10/19/2015 07:34 10/19/2015 08:28 10/19/2015 09:45  Glucose-Capillary Latest Ref Range: 65-99 mg/dL 171 (H) 177 (H) 175 (H) 198 (H) 190 (H) 186 (H) 163 (H) 138 (H) 154 (H) 161 (H)   Results for CHRISTLE, NOLTING (MRN [id number]) as of 10/19/2015 10:16  Ref. Range 10/18/2015 11:59  Hemoglobin A1C Latest Ref Range: 4.8-5.6 % 10.6 (H)   Review of Glycemic Control  Diabetes history: DM2 Outpatient Diabetes medications: Metformin XR 500 mg with breakfast, Metformin XR 500 mg with lunch, Metformin XR 1000 mg with supper Current orders for Inpatient glycemic control: Novolin R insulin drip  Inpatient Diabetes Program Recommendations: Insulin - Basal: Noted that Triglycerides down to 1018 yesterday at 11:59 am.  In reviewing GlucoStabilizer flowsheet, noted patient has received a total of 30.7 units of insulin over the past 10 hours. When MD wants to transition from IV to SQ insulin, please consider ordering Lantus 30 units Q24H (based on 95 kg x 0.3 units). Correction (SSI): When MD wants to transition from IV to SQ insulin, please consider ordering Novolog 0-9 units Q4H. HgbA1C: A1C 10.3% on 10/18/15 indicating an average glucose of 249 mg/dl.  NOTE: Ordered Living Well with Diabetes book, insulin starter kit, and patient diabetes education videos. Please use each patient interaction to provide diabetes education. Please review Living Well with Diabetes booklet with the patient, have patient watch patient education videos on diabetes, and instruct on  insulin administration. Please allow patient to be actively engaged with diabetes management by allowing patient to check own glucose and self-administer insulin injections.  Thanks, Barnie Alderman, RN, MSN, CDE Diabetes Coordinator Inpatient Diabetes Program [phone] (Team Pager from [place] to [place]) [phone] ([place] office) [phone] [facility] office) [phone] [facility] office)
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
## 2015-10-20 ENCOUNTER — Inpatient Hospital Stay (HOSPITAL_COMMUNITY): Payer: BLUE CROSS/BLUE SHIELD

## 2015-10-20 LAB — GLUCOSE, CAPILLARY
GLUCOSE-CAPILLARY: 129 mg/dL — AB (ref 65–99)
GLUCOSE-CAPILLARY: 138 mg/dL — AB (ref 65–99)
GLUCOSE-CAPILLARY: 146 mg/dL — AB (ref 65–99)
GLUCOSE-CAPILLARY: 155 mg/dL — AB (ref 65–99)
GLUCOSE-CAPILLARY: 161 mg/dL — AB (ref 65–99)
GLUCOSE-CAPILLARY: 189 mg/dL — AB (ref 65–99)
GLUCOSE-CAPILLARY: 189 mg/dL — AB (ref 65–99)
GLUCOSE-CAPILLARY: 190 mg/dL — AB (ref 65–99)
GLUCOSE-CAPILLARY: 192 mg/dL — AB (ref 65–99)
Glucose-Capillary: 137 mg/dL — ABNORMAL HIGH (ref 65–99)
Glucose-Capillary: 149 mg/dL — ABNORMAL HIGH (ref 65–99)
Glucose-Capillary: 157 mg/dL — ABNORMAL HIGH (ref 65–99)
Glucose-Capillary: 157 mg/dL — ABNORMAL HIGH (ref 65–99)
Glucose-Capillary: 161 mg/dL — ABNORMAL HIGH (ref 65–99)
Glucose-Capillary: 177 mg/dL — ABNORMAL HIGH (ref 65–99)
Glucose-Capillary: 178 mg/dL — ABNORMAL HIGH (ref 65–99)
Glucose-Capillary: 180 mg/dL — ABNORMAL HIGH (ref 65–99)
Glucose-Capillary: 182 mg/dL — ABNORMAL HIGH (ref 65–99)
Glucose-Capillary: 192 mg/dL — ABNORMAL HIGH (ref 65–99)

## 2015-10-20 LAB — BASIC METABOLIC PANEL
Anion gap: 12 (ref 5–15)
BUN: <5 mg/dL — ABNORMAL LOW (ref 6–20)
CALCIUM: 8.1 mg/dL — AB (ref 8.9–10.3)
CHLORIDE: 109 mmol/L (ref 101–111)
CO2: 19 mmol/L — AB (ref 22–32)
CREATININE: 0.53 mg/dL (ref 0.44–1.00)
GFR calc non Af Amer: >60 mL/min (ref >60)
GLUCOSE: 179 mg/dL — AB (ref 65–99)
Potassium: 4.2 mmol/L (ref 3.5–5.1)
Sodium: 140 mmol/L (ref 135–145)

## 2015-10-20 MED ORDER — LORAZEPAM 2 MG/ML IJ SOLN
2.0000 mg | INTRAMUSCULAR | Status: DC | PRN
  Administered 2015-10-20 – 2015-10-23 (×6): 2 mg via INTRAVENOUS
  Filled 2015-10-20 (×8): qty 1

## 2015-10-20 NOTE — Progress Notes (Signed)
PROGRESS NOTE  [name] ZOX:[id number] DOB: [birth year] DOA: 10/15/2015 PCP: Hoyle Sauer, MD Outpatient Specialists:    LOS: 5 days   Brief Narrative: [name] is a 50 y.o. woman with a history of Type 2 DM (level of control unknown; treated with metformin), GERD, and recurrent pancreatitis (2010, 2012, 2014) who was admitted on 4/14 with acute pancreatitis. She was doing well for the first couple days, however on 4/18 she has been more encephalopathic.   Assessment & Plan: Principal Problem:   Acute pancreatitis Active Problems:   Diabetes mellitus, type 2 (HCC)   DKA (diabetic ketoacidoses) (HCC)   Hypertriglyceridemia   Acute recurrent pancreatitis: Likely due to severe hypertriglyceridemia - CT abdomen consistent with acute pancreatitis - Lipid panel showed triglycerides over 5000, Continue TriCor - Continue NPO due to encephalopathy and ongoing abdominal pain - Lipids panel showed improving triglycerides to 1018  Acute encephalopathy unclear possibly due to narcotics - She continues to be encephalopathic today - Dr. Isidoro Donning in detail with the patient's husband, he confirms that she does not drink and they have no alcohol drinks in the house. Hence alcohol withdrawal less likely. Patient's husband strongly thinks it's because of her pain medications, patient does not take narcotics at home - Dilaudid and oxycodone have been discontinued on 4/18. Placed on fentanyl and hydrocodone at lower doses - Using minimally overnight  Hyponatremia, hypocalcemia: Improving - Likely due to profound dehydration, acute pancreatitis and DKA - Continue IV fluid hydration, IV calcium replacement BID   Severe Hypertriglyceridemia - Placed on Tricor, will also place on low-dose statin at discharge.  - Rule out familial hypertriglyceridemia, recommended patient to follow with endocrinology outpatient  DKA, anion gap of 21 with bicarb of 11 and ketones in urine - Gap has closed  bicarbonate 21, however patient has severe pancreatitis and NPO - will continue IV fluids and IV insulin glucose stabilizer, will transition to subcutaneous insulin after starting on diet, hopefully in 1 day - Continue to hold metformin - She will likely need insulin at home  Diarrhea - Patient with profuse diarrhea overnight, watery, rule out C. difficile  DVT prophylaxis: SCD Code Status: Full code Family Communication: no family at bedside Disposition Plan: remain in SDU Barriers for discharge: acute encephalopathy  Consultants:   None   Procedures:   None   Antimicrobials:  None    Subjective: - Appears confused this morning,   Objective: Filed Vitals:   10/19/15 2157 10/19/15 2345 10/20/15 0436 10/20/15 0923  BP:  143/90 147/92 148/112  Pulse: 118 119  112  Temp:  98.5 F (36.9 C) 99.4 F (37.4 C) 98.4 F (36.9 C)  TempSrc:  Oral Oral Oral  Resp: 30 35 35 36  Height:      Weight:      SpO2: 91% 96% 95%     Intake/Output Summary (Last 24 hours) at 10/20/15 0929 Last data filed at 10/20/15 0700  Gross per 24 hour  Intake 3132.15 ml  Output      0 ml  Net 3132.15 ml   Filed Weights   10/16/15 1200  Weight: 95.301 kg (210 lb 1.6 oz)    Examination: Constitutional: Appears agitated Filed Vitals:   10/19/15 2157 10/19/15 2345 10/20/15 0436 10/20/15 0923  BP:  143/90 147/92 148/112  Pulse: 118 119  112  Temp:  98.5 F (36.9 C) 99.4 F (37.4 C) 98.4 F (36.9 C)  TempSrc:  Oral Oral Oral  Resp: 30 35 35 36  Height:  Weight:      SpO2: 91% 96% 95%    Eyes: PERRL ENMT: Mucous membranes are moist.  Respiratory: clear to auscultation bilaterally, no wheezing, no crackles. Normal respiratory effort. No accessory muscle use.  Cardiovascular: Regular rate and rhythm, no murmurs / rubs / gallops. No extremity edema. 2+ pedal pulses. Tachycardic Abdomen: tenderness to palpation midepigastric area,  Bowel sounds positive.  Musculoskeletal: no  clubbing / cyanosis.  Neurologic: Not following commands consistently due to agitation, but appears nonfocal Psychiatric: Alert and oriented to person only   Data Reviewed: I have personally reviewed following labs and imaging studies  CBC:  Recent Labs Lab 10/15/15 0433  10/15/15 2034 10/16/15 0624 10/17/15 0348 10/18/15 0422 10/19/15 0511  WBC 6.8  --  10.3 7.6 6.8 9.3 10.5  NEUTROABS 5.2  --  8.8*  --   --   --   --   HGB 10.8*  < > 13.1 13.3 13.0 11.5* 11.0*  HCT 31.0*  < > 38.1 38.5 37.5 34.2* 34.4*  MCV 89.3  --  87.8 90.6 89.9 88.6 90.5  PLT 238  --  308 331 346 332 364  < > = values in this interval not displayed. Basic Metabolic Panel:  Recent Labs Lab 10/16/15 1153  10/16/15 2228 10/17/15 0348 10/18/15 0422 10/19/15 0511 10/20/15 0745  NA 138  < > 129* 135 136 139 140  K 3.3*  < > 3.9 4.0 2.9* 4.1 4.2  CL 110  < > 107 108 106 108 109  CO2 15*  < > 14* 18* 19* 21* 19*  GLUCOSE 217*  < > 175* 147* 185* 200* 179*  BUN <5*  < > <5* <5* <5* <5* <5*  CREATININE 0.60  < > 0.45 0.57 0.44 0.55 0.53  CALCIUM 6.0*  < > 5.2* 5.9* 5.8* 6.5* 8.1*  MG 1.4*  --   --   --  2.3  --   --   < > = values in this interval not displayed. GFR: Estimated Creatinine Clearance: 97.1 mL/min (by C-G formula based on Cr of 0.53). Liver Function Tests:  Recent Labs Lab 10/15/15 2034 10/16/15 1153 10/17/15 0348  AST 20  --  39  ALT 19  --  10*  ALKPHOS 63  --  48  BILITOT 1.8*  --  1.8*  PROT 6.5  --  5.3*  ALBUMIN 3.1* 2.4* 2.1*    Recent Labs Lab 10/15/15 2034 10/16/15 0624 10/17/15 0348 10/18/15 0422 10/19/15 0511  LIPASE 942* 1170* 192* 101* 67*    Recent Labs Lab 10/19/15 0732  AMMONIA 22   HbA1C:  Recent Labs  10/18/15 1159  HGBA1C 10.6*   CBG:  Recent Labs Lab 10/20/15 0415 10/20/15 0520 10/20/15 0624 10/20/15 0736 10/20/15 0843  GLUCAP 157* 161* 146* 149* 161*   Lipid Profile:  Recent Labs  10/18/15 1159  CHOL 294*  HDL 17*    LDLCALC UNABLE TO CALCULATE IF TRIGLYCERIDE OVER 400 mg/dL  TRIG 1610*  CHOLHDL 96.0   Thyroid Function Tests: No results for input(s): TSH, T4TOTAL, FREET4, T3FREE, THYROIDAB in the last 72 hours. Anemia Panel: No results for input(s): VITAMINB12, FOLATE, FERRITIN, TIBC, IRON, RETICCTPCT in the last 72 hours. Urine analysis:    Component Value Date/Time   COLORURINE YELLOW 10/19/2015 0423   APPEARANCEUR CLEAR 10/19/2015 0423   LABSPEC 1.023 10/19/2015 0423   PHURINE 5.5 10/19/2015 0423   GLUCOSEU >1000* 10/19/2015 0423   HGBUR LARGE* 10/19/2015 0423   BILIRUBINUR NEGATIVE  10/19/2015 0423   KETONESUR 15* 10/19/2015 0423   PROTEINUR NEGATIVE 10/19/2015 0423   UROBILINOGEN 0.2 11/05/2008 1339   NITRITE NEGATIVE 10/19/2015 0423   LEUKOCYTESUR NEGATIVE 10/19/2015 0423   Sepsis Labs: Invalid input(s): PROCALCITONIN, LACTICIDVEN  Recent Results (from the past 240 hour(s))  MRSA PCR Screening     Status: None   Collection Time: 10/16/15 10:34 AM  Result Value Ref Range Status   MRSA by PCR NEGATIVE NEGATIVE Final    Comment:        The GeneXpert MRSA Assay (FDA approved for NASAL specimens only), is one component of a comprehensive MRSA colonization surveillance program. It is not intended to diagnose MRSA infection nor to guide or monitor treatment for MRSA infections.       Radiology Studies: Dg Abd Portable 2v  10/20/2015  CLINICAL DATA:  Acute pancreatitis. EXAM: PORTABLE ABDOMEN - 2 VIEW COMPARISON:  CT scan of October 16, 2015. FINDINGS: The bowel gas pattern is normal. There is no evidence of free air. Phleboliths are noted in the pelvis. IMPRESSION: No definite evidence of bowel obstruction or ileus. Electronically Signed   By: Lupita RaiderJames  Green Jr, M.D.   On: 10/20/2015 07:54     Scheduled Meds: . calcium gluconate  1 g Intravenous BID  . fenofibrate  160 mg Oral Daily  . fluticasone  1 spray Each Nare QHS  . folic acid  1 mg Oral Daily  . multivitamin with  minerals  1 tablet Oral Daily  . pantoprazole (PROTONIX) IV  40 mg Intravenous Q24H  . potassium chloride  40 mEq Oral BID  . prochlorperazine  10 mg Intravenous Once  . senna-docusate  1 tablet Oral BID  . sodium chloride flush  3 mL Intravenous Q12H  . thiamine  100 mg Oral Daily   Or  . thiamine  100 mg Intravenous Daily   Continuous Infusions: . sodium chloride Stopped (10/17/15 0730)  . dextrose 5 % and 0.45% NaCl 1,000 mL with potassium chloride 40 mEq infusion 125 mL/hr at 10/20/15 0852  . insulin (NOVOLIN-R) infusion 3 Units/hr (10/20/15 16100851)    Katherine Pertostin Gherghe, MD, PhD [facility] Pager [phone]  If 7PM-7AM, please contact night-coverage [email or web address] Password [email or web address] 10/20/2015, 9:29 AM

## 2015-10-21 ENCOUNTER — Inpatient Hospital Stay (HOSPITAL_COMMUNITY): Payer: BLUE CROSS/BLUE SHIELD

## 2015-10-21 LAB — COMPREHENSIVE METABOLIC PANEL
ALK PHOS: 77 U/L (ref 38–126)
ALT: 14 U/L (ref 14–54)
ANION GAP: 12 (ref 5–15)
AST: 20 U/L (ref 15–41)
Albumin: 1.8 g/dL — ABNORMAL LOW (ref 3.5–5.0)
BILIRUBIN TOTAL: 1.1 mg/dL (ref 0.3–1.2)
BUN: <5 mg/dL — ABNORMAL LOW (ref 6–20)
CALCIUM: 8.9 mg/dL (ref 8.9–10.3)
CO2: 21 mmol/L — ABNORMAL LOW (ref 22–32)
Chloride: 104 mmol/L (ref 101–111)
Creatinine, Ser: 0.62 mg/dL (ref 0.44–1.00)
Glucose, Bld: 199 mg/dL — ABNORMAL HIGH (ref 65–99)
Potassium: 4.3 mmol/L (ref 3.5–5.1)
Sodium: 137 mmol/L (ref 135–145)
TOTAL PROTEIN: 5.8 g/dL — AB (ref 6.5–8.1)

## 2015-10-21 LAB — GLUCOSE, CAPILLARY
GLUCOSE-CAPILLARY: 186 mg/dL — AB (ref 65–99)
GLUCOSE-CAPILLARY: 160 mg/dL — AB (ref 65–99)
GLUCOSE-CAPILLARY: 155 mg/dL — AB (ref 65–99)
GLUCOSE-CAPILLARY: 214 mg/dL — AB (ref 65–99)
GLUCOSE-CAPILLARY: 181 mg/dL — AB (ref 65–99)
GLUCOSE-CAPILLARY: 207 mg/dL — AB (ref 65–99)
Glucose-Capillary: 162 mg/dL — ABNORMAL HIGH (ref 65–99)
Glucose-Capillary: 168 mg/dL — ABNORMAL HIGH (ref 65–99)
Glucose-Capillary: 172 mg/dL — ABNORMAL HIGH (ref 65–99)
Glucose-Capillary: 187 mg/dL — ABNORMAL HIGH (ref 65–99)

## 2015-10-21 LAB — CBC
HCT: 35.7 % — ABNORMAL LOW (ref 36.0–46.0)
HEMOGLOBIN: 11.2 g/dL — AB (ref 12.0–15.0)
MCH: 28.7 pg (ref 26.0–34.0)
MCHC: 31.4 g/dL (ref 30.0–36.0)
MCV: 91.5 fL (ref 78.0–100.0)
Platelets: 382 10*3/uL (ref 150–400)
RBC: 3.9 MIL/uL (ref 3.87–5.11)
RDW: 14.7 % (ref 11.5–15.5)
WBC: 11.1 10*3/uL — AB (ref 4.0–10.5)

## 2015-10-21 MED ORDER — FENTANYL CITRATE (PF) 100 MCG/2ML IJ SOLN
12.5000 ug | Freq: Three times a day (TID) | INTRAMUSCULAR | Status: DC | PRN
  Administered 2015-10-23: 25 ug via INTRAVENOUS
  Filled 2015-10-21: qty 2

## 2015-10-21 MED ORDER — INSULIN ASPART 100 UNIT/ML ~~LOC~~ SOLN
0.0000 [IU] | Freq: Three times a day (TID) | SUBCUTANEOUS | Status: DC
  Administered 2015-10-21: 3 [IU] via SUBCUTANEOUS
  Administered 2015-10-22 (×2): 2 [IU] via SUBCUTANEOUS
  Administered 2015-10-22 – 2015-10-23 (×3): 3 [IU] via SUBCUTANEOUS
  Administered 2015-10-23: 2 [IU] via SUBCUTANEOUS
  Administered 2015-10-24 (×2): 3 [IU] via SUBCUTANEOUS
  Administered 2015-10-24: 2 [IU] via SUBCUTANEOUS
  Administered 2015-10-25: 6 [IU] via SUBCUTANEOUS
  Administered 2015-10-25 – 2015-10-26 (×4): 3 [IU] via SUBCUTANEOUS

## 2015-10-21 MED ORDER — IBUPROFEN 400 MG PO TABS
400.0000 mg | ORAL_TABLET | Freq: Four times a day (QID) | ORAL | Status: DC | PRN
  Administered 2015-10-21 – 2015-10-24 (×10): 400 mg via ORAL
  Filled 2015-10-21 (×3): qty 1
  Filled 2015-10-21 (×2): qty 2
  Filled 2015-10-21: qty 1
  Filled 2015-10-21: qty 2
  Filled 2015-10-21 (×2): qty 1
  Filled 2015-10-21 (×2): qty 2
  Filled 2015-10-21 (×2): qty 1

## 2015-10-21 MED ORDER — INSULIN GLARGINE 100 UNIT/ML ~~LOC~~ SOLN
5.0000 [IU] | Freq: Every day | SUBCUTANEOUS | Status: DC
  Administered 2015-10-21 – 2015-10-22 (×2): 5 [IU] via SUBCUTANEOUS
  Filled 2015-10-21 (×2): qty 0.05

## 2015-10-21 MED ORDER — SENNOSIDES-DOCUSATE SODIUM 8.6-50 MG PO TABS: 1.0000 | ORAL_TABLET | Freq: Every evening | ORAL | Status: DC | PRN

## 2015-10-21 NOTE — Progress Notes (Addendum)
Pt has been extremely confused/agitated throughout the night.  She sometimes knows that she is in the hospital but then is talking to family members that aren't there, or her sentences do not make any sense.  She complains of severe lower back pain, very restless and moving all around the bed/trying to get up. Pt has been crying/calling out in pain the majority of the night despite Fentanyl pain management.  RN will continue to monitor.

## 2015-10-21 NOTE — Progress Notes (Signed)
PROGRESS NOTE  Katherine IvoryDonna [name] [id number] DOB: 06-24-1966 DOA: 10/15/2015 PCP: Hoyle SauerAVVA,RAVISANKAR R, MD Outpatient Specialists:    LOS: 6 days   Brief Narrative: Katherine IvoryDonna [name] is a 50 y.o. woman with a history of Type 2 DM (level of control unknown; treated with metformin), GERD, and recurrent pancreatitis (2010, 2012, 2014) who was admitted on 4/14 with acute pancreatitis. She was doing well for the first couple days, however on 4/18 she has been more encephalopathic.   Assessment & Plan: Principal Problem:   Acute pancreatitis Active Problems:   Diabetes mellitus, type 2 (HCC)   DKA (diabetic ketoacidoses) (HCC)   Hypertriglyceridemia   Acute recurrent pancreatitis: Likely due to severe hypertriglyceridemia - CT abdomen consistent with acute pancreatitis - Lipid panel showed triglycerides over 5000, Continue TriCor - Continue NPO due to encephalopathy and ongoing abdominal pain - Lipids panel showed improving triglycerides to 1018  Acute encephalopathy unclear possibly due to narcotics - She continues to be encephalopathic today, however with mild improvement - Dr. Isidoro Donningai in detail with the patient's husband, he confirms that she does not drink and they have no alcohol drinks in the house. Hence alcohol withdrawal less likely. Patient's husband strongly thinks it's because of her pain medications, patient does not take narcotics at home - Dilaudid and oxycodone have been discontinued on 4/18. Placed on fentanyl and hydrocodone at lower doses - minimize use,  Hyponatremia, hypocalcemia: Improving - Likely due to profound dehydration, acute pancreatitis and DKA  Severe Hypertriglyceridemia - Placed on Tricor, will also place on low-dose statin at discharge.  - Rule out familial hypertriglyceridemia, recommended patient to follow with endocrinology outpatient  DKA, anion gap of 21 with bicarb of 11 and ketones in urine - Gap has closed bicarbonate 21, however patient has severe  pancreatitis and NPO - was on dextrose and IV insulin glucose stabilizer - Continue to hold metformin - She will likely need insulin at home - start clears today, off dextrose and insulin gtt. Start Lantus / SSI  Diarrhea - Patient with profuse diarrhea overnight, watery, rule out C. difficile   DVT prophylaxis: SCD Code Status: Full code Family Communication: no family at bedside Disposition Plan: remain in SDU Barriers for discharge: acute encephalopathy  Consultants:   None   Procedures:   None   Antimicrobials:  None     Subjective: - Appears confused this morning, calmer than yesterday   Objective: Filed Vitals:   10/21/15 0010 10/21/15 0335 10/21/15 0816 10/21/15 1214  BP: 140/90 144/85 137/77 140/92  Pulse:    119  Temp: 98.5 F (36.9 C) 98.4 F (36.9 C) 98.4 F (36.9 C) 100.1 F (37.8 C)  TempSrc: Oral Oral Oral Axillary  Resp: 27 25 29 31   Height:      Weight:      SpO2: 98% 95%  97%    Intake/Output Summary (Last 24 hours) at 10/21/15 1215 Last data filed at 10/21/15 0915  Gross per 24 hour  Intake   2725 ml  Output   3155 ml  Net   -430 ml   Filed Weights   10/16/15 1200  Weight: 95.301 kg (210 lb 1.6 oz)    Examination: Constitutional: NAD. Confused  Filed Vitals:   10/21/15 0010 10/21/15 0335 10/21/15 0816 10/21/15 1214  BP: 140/90 144/85 137/77 140/92  Pulse:    119  Temp: 98.5 F (36.9 C) 98.4 F (36.9 C) 98.4 F (36.9 C) 100.1 F (37.8 C)  TempSrc: Oral Oral Oral Axillary  Resp: 27  Height:      Weight:      SpO2: 98% 95%  97%   Eyes: PERRL ENMT: Mucous membranes are moist.  Respiratory: clear to auscultation bilaterally, no wheezing, no crackles.  Cardiovascular: Regular rate and rhythm, no murmurs / rubs / gallops. No extremity edema. 2+ pedal pulses. Tachycardic Abdomen: tenderness to palpation midepigastric area, improving today, Bowel sounds positive.  Musculoskeletal: no clubbing / cyanosis.    Neurologic: non focal  Psychiatric: Alert and oriented to person only  Data Reviewed: I have personally reviewed following labs and imaging studies  CBC:  Recent Labs Lab 10/15/15 0433  10/15/15 2034 10/16/15 0624 10/17/15 0348 10/18/15 0422 10/19/15 0511 10/21/15 0335  WBC 6.8  --  10.3 7.6 6.8 9.3 10.5 11.1*  NEUTROABS 5.2  --  8.8*  --   --   --   --   --   HGB 10.8*  < > 13.1 13.3 13.0 11.5* 11.0* 11.2*  HCT 31.0*  < > 38.1 38.5 37.5 34.2* 34.4* 35.7*  MCV 89.3  --  87.8 90.6 89.9 88.6 90.5 91.5  PLT 238  --  308 331 346 332 364 382  < > = values in this interval not displayed. Basic Metabolic Panel:  Recent Labs Lab 10/16/15 1153  10/17/15 0348 10/18/15 0422 10/19/15 0511 10/20/15 0745 10/21/15 0335  NA 138  < > 135 136 139 140 137  K 3.3*  < > 4.0 2.9* 4.1 4.2 4.3  CL 110  < > 108 106 108 109 104  CO2 15*  < > 18* 19* 21* 19* 21*  GLUCOSE 217*  < > 147* 185* 200* 179* 199*  BUN <5*  < > <5* <5* <5* <5* <5*  CREATININE 0.60  < > 0.57 0.44 0.55 0.53 0.62  CALCIUM 6.0*  < > 5.9* 5.8* 6.5* 8.1* 8.9  MG 1.4*  --   --  2.3  --   --   --   < > = values in this interval not displayed. GFR: Estimated Creatinine Clearance: 97.1 mL/min (by C-G formula based on Cr of 0.62). Liver Function Tests:  Recent Labs Lab 10/15/15 2034 10/16/15 1153 10/17/15 0348 10/21/15 0335  AST 20  --  39 20  ALT 19  --  10* 14  ALKPHOS 63  --  48 77  BILITOT 1.8*  --  1.8* 1.1  PROT 6.5  --  5.3* 5.8*  ALBUMIN 3.1* 2.4* 2.1* 1.8*    Recent Labs Lab 10/15/15 2034 10/16/15 0624 10/17/15 0348 10/18/15 0422 10/19/15 0511  LIPASE 942* 1170* 192* 101* 67*    Recent Labs Lab 10/19/15 0732  AMMONIA 22   HbA1C: No results for input(s): HGBA1C in the last 72 hours. CBG:  Recent Labs Lab 10/21/15 0259 10/21/15 0455 10/21/15 0602 10/21/15 0705 10/21/15 0813  GLUCAP 162* 186* 168* 160* 155*   Lipid Profile: No results for input(s): CHOL, HDL, LDLCALC, TRIG, CHOLHDL,  LDLDIRECT in the last 72 hours. Thyroid Function Tests: No results for input(s): TSH, T4TOTAL, FREET4, T3FREE, THYROIDAB in the last 72 hours. Anemia Panel: No results for input(s): VITAMINB12, FOLATE, FERRITIN, TIBC, IRON, RETICCTPCT in the last 72 hours. Urine analysis:    Component Value Date/Time   COLORURINE YELLOW 10/19/2015 0423   APPEARANCEUR CLEAR 10/19/2015 0423   LABSPEC 1.023 10/19/2015 0423   PHURINE 5.5 10/19/2015 0423   GLUCOSEU >1000* 10/19/2015 0423   HGBUR LARGE* 10/19/2015 0423   BILIRUBINUR NEGATIVE 10/19/2015 0423  KETONESUR 15* 10/19/2015 0423   PROTEINUR NEGATIVE 10/19/2015 0423   UROBILINOGEN 0.2 11/05/2008 1339   NITRITE NEGATIVE 10/19/2015 0423   LEUKOCYTESUR NEGATIVE 10/19/2015 0423   Sepsis Labs: Invalid input(s): PROCALCITONIN, LACTICIDVEN  Recent Results (from the past 240 hour(s))  MRSA PCR Screening     Status: None   Collection Time: 10/16/15 10:34 AM  Result Value Ref Range Status   MRSA by PCR NEGATIVE NEGATIVE Final    Comment:        The GeneXpert MRSA Assay (FDA approved for NASAL specimens only), is one component of a comprehensive MRSA colonization surveillance program. It is not intended to diagnose MRSA infection nor to guide or monitor treatment for MRSA infections.       Radiology Studies: Dg Abd Portable 2v  10/20/2015  CLINICAL DATA:  Acute pancreatitis. EXAM: PORTABLE ABDOMEN - 2 VIEW COMPARISON:  CT scan of October 16, 2015. FINDINGS: The bowel gas pattern is normal. There is no evidence of free air. Phleboliths are noted in the pelvis. IMPRESSION: No definite evidence of bowel obstruction or ileus. Electronically Signed   By: Lupita Raider, M.D.   On: 10/20/2015 07:54     Scheduled Meds: . fenofibrate  160 mg Oral Daily  . fluticasone  1 spray Each Nare QHS  . folic acid  1 mg Oral Daily  . insulin aspart  0-9 Units Subcutaneous TID WC  . insulin glargine  5 Units Subcutaneous Daily  . multivitamin with minerals   1 tablet Oral Daily  . pantoprazole (PROTONIX) IV  40 mg Intravenous Q24H  . prochlorperazine  10 mg Intravenous Once  . sodium chloride flush  3 mL Intravenous Q12H  . thiamine  100 mg Oral Daily   Or  . thiamine  100 mg Intravenous Daily   Continuous Infusions: . sodium chloride 75 mL/hr at 10/21/15 0839    Pamella Pert, MD, PhD [facility] Pager [phone]  If 7PM-7AM, please contact night-coverage [email or web address] Password [facility] 10/21/2015, 12:15 PM

## 2015-10-21 NOTE — Progress Notes (Addendum)
Inpatient Diabetes Program Recommendations  AACE/ADA: New Consensus Statement on Inpatient Glycemic Control (2015)  Target Ranges:  Prepandial:   less than 140 mg/dL      Peak postprandial:   less than 180 mg/dL (1-2 hours)      Critically ill patients:  140 - 180 mg/dL   Review of Glycemic Control-Note that patient transitioned off insulin drip this AM.  Insulin drip rates approximately 4 units/hr.  Diabetes history: Type 2 diabetes Outpatient Diabetes medications: Metformin 500 mg AM and 1000 mg PM Current orders for Inpatient glycemic control:  Novolog sensitive tid with meals, Lantus 5 units daily Inpatient Diabetes Program Recommendations:   Please consider increasing Lantus to 30 units daily. (0.3 unit/kg).  Once eating may consider adding Novolog 4 units tid with meals. Will follow.  Thanks, Beryl MeagerJenny Averill Pons, RN, BC-ADM Inpatient Diabetes Coordinator Pager [phone] (8a-5p)

## 2015-10-22 ENCOUNTER — Inpatient Hospital Stay (HOSPITAL_COMMUNITY): Payer: BLUE CROSS/BLUE SHIELD

## 2015-10-22 DIAGNOSIS — R4182 Altered mental status, unspecified: Secondary | ICD-10-CM

## 2015-10-22 LAB — LIPASE, BLOOD: LIPASE: 41 U/L (ref 11–51)

## 2015-10-22 LAB — COMPREHENSIVE METABOLIC PANEL
ALK PHOS: 65 U/L (ref 38–126)
ALT: 16 U/L (ref 14–54)
AST: 17 U/L (ref 15–41)
Albumin: 1.8 g/dL — ABNORMAL LOW (ref 3.5–5.0)
Anion gap: 13 (ref 5–15)
BILIRUBIN TOTAL: 1.2 mg/dL (ref 0.3–1.2)
CALCIUM: 8.9 mg/dL (ref 8.9–10.3)
CHLORIDE: 101 mmol/L (ref 101–111)
CO2: 24 mmol/L (ref 22–32)
CREATININE: 0.65 mg/dL (ref 0.44–1.00)
GFR calc Af Amer: >60 mL/min (ref >60)
Glucose, Bld: 268 mg/dL — ABNORMAL HIGH (ref 65–99)
Potassium: 4 mmol/L (ref 3.5–5.1)
Sodium: 138 mmol/L (ref 135–145)
Total Protein: 5.5 g/dL — ABNORMAL LOW (ref 6.5–8.1)

## 2015-10-22 LAB — GLUCOSE, CAPILLARY
Glucose-Capillary: 197 mg/dL — ABNORMAL HIGH (ref 65–99)
Glucose-Capillary: 223 mg/dL — ABNORMAL HIGH (ref 65–99)
Glucose-Capillary: 210 mg/dL — ABNORMAL HIGH (ref 65–99)
Glucose-Capillary: 234 mg/dL — ABNORMAL HIGH (ref 65–99)

## 2015-10-22 LAB — AMMONIA: AMMONIA: 17 umol/L (ref 9–35)

## 2015-10-22 LAB — TRIGLYCERIDES: Triglycerides: 199 mg/dL — ABNORMAL HIGH (ref <150)

## 2015-10-22 LAB — TSH: TSH: 1.076 u[IU]/mL (ref 0.350–4.500)

## 2015-10-22 MED ORDER — INSULIN GLARGINE 100 UNIT/ML ~~LOC~~ SOLN
5.0000 [IU] | Freq: Once | SUBCUTANEOUS | Status: AC
  Administered 2015-10-22: 5 [IU] via SUBCUTANEOUS
  Filled 2015-10-22: qty 0.05

## 2015-10-22 MED ORDER — INSULIN GLARGINE 100 UNIT/ML ~~LOC~~ SOLN
10.0000 [IU] | Freq: Every day | SUBCUTANEOUS | Status: DC
  Administered 2015-10-23 – 2015-10-25 (×3): 10 [IU] via SUBCUTANEOUS
  Filled 2015-10-22 (×3): qty 0.1

## 2015-10-22 NOTE — Progress Notes (Signed)
Patient arrived to 3e20 from 2c.  Alert to person only. Cooperative with nursing care. ST on telemetry. VS stable. No skin breakdown noted. Husband with wife at admission.

## 2015-10-22 NOTE — Progress Notes (Signed)
PROGRESS NOTE  Katherine IvoryDonna [name] JXB:[id number] DOB: [birth year] DOA: 10/15/2015 PCP: Hoyle SauerAVVA,RAVISANKAR R, MD Outpatient Specialists:    LOS: 7 days   Brief Narrative: Katherine IvoryDonna Bartelson is a 50 y.o. woman with a history of Type 2 DM (level of control unknown; treated with metformin), GERD, and recurrent pancreatitis (2010, 2012, 2014) who was admitted on 4/14 with acute pancreatitis. She was doing well for the first couple days, however on 4/18 she has been more encephalopathic.   Assessment & Plan: Principal Problem:   Acute pancreatitis Active Problems:   Diabetes mellitus, type 2 (HCC)   DKA (diabetic ketoacidoses) (HCC)   Hypertriglyceridemia   Acute recurrent pancreatitis: Likely due to severe hypertriglyceridemia - CT abdomen consistent with acute pancreatitis - Lipid panel showed triglycerides over 5000, Continue TriCor - Continue NPO due to encephalopathy and ongoing abdominal pain - Lipids panel showed improving triglycerides to 1018, repeat pending this morning  Acute encephalopathy unclear possibly due to narcotics - She continues to be encephalopathic today, however with mild improvement - Dr. Isidoro Donningai in detail with the patient's husband, he confirms that she does not drink and they have no alcohol drinks in the house. Hence alcohol withdrawal less likely. Patient's husband strongly thinks it's because of her pain medications, patient does not take narcotics at home - Dilaudid and oxycodone have been discontinued on 4/18. Placed on fentanyl and hydrocodone at lower doses - minimize use, patient without any narcotics since yesterday. Continues to be encephalopathic. Neurology consulted, appreciate input.   Hyponatremia, hypocalcemia: Improving - Likely due to profound dehydration, acute pancreatitis and DKA  Severe Hypertriglyceridemia - Placed on Tricor, will also place on low-dose statin at discharge.  - Rule out familial hypertriglyceridemia, recommended patient to follow with  endocrinology outpatient  DKA, anion gap of 21 with bicarb of 11 and ketones in urine - Gap has closed bicarbonate 21, however patient has severe pancreatitis and NPO - was on dextrose and IV insulin glucose stabilizer - Continue to hold metformin - She will likely need insulin at home - continue clears, uptitrate Lantus  Diarrhea - resolved   DVT prophylaxis: SCD Code Status: Full code Family Communication: d/w husband Disposition Plan: floor transfer Barriers for discharge: acute encephalopathy  Consultants:   None   Procedures:   None   Antimicrobials:  None     Subjective: - ongoing confusion, [place]  Objective: Filed Vitals:   10/21/15 2350 10/22/15 0401 10/22/15 0403 10/22/15 0801  BP: 126/76  111/72 130/89  Pulse: 108     Temp: 98.6 F (37 C) 97.8 F (36.6 C)  98.3 F (36.8 C)  TempSrc: Oral Oral  Oral  Resp: 23  25 29   Height:      Weight:      SpO2: 98%  98%     Intake/Output Summary (Last 24 hours) at 10/22/15 1000 Last data filed at 10/22/15 0951  Gross per 24 hour  Intake 1861.25 ml  Output    950 ml  Net 911.25 ml   Filed Weights   10/16/15 1200  Weight: 95.301 kg (210 lb 1.6 oz)    Examination: Constitutional: NAD. Confused  Filed Vitals:   10/21/15 2350 10/22/15 0401 10/22/15 0403 10/22/15 0801  BP: 126/76  111/72 130/89  Pulse: 108     Temp: 98.6 F (37 C) 97.8 F (36.6 C)  98.3 F (36.8 C)  TempSrc: Oral Oral  Oral  Resp: 23  25 29   Height:  Weight:      SpO2: 98%  98%    Eyes: PERRL ENMT: Mucous membranes are moist.  Respiratory: clear to auscultation bilaterally, no wheezing, no crackles.  Cardiovascular: Regular rate and rhythm, no murmurs / rubs / gallops. No extremity edema. 2+ pedal pulses. Tachycardic Abdomen: tenderness to palpation midepigastric area, improving today, Bowel sounds positive.  Musculoskeletal: no clubbing / cyanosis.  Neurologic: non focal  Psychiatric: Alert  and oriented to person only  Data Reviewed: I have personally reviewed following labs and imaging studies  CBC:  Recent Labs Lab 10/15/15 2034 10/16/15 0624 10/17/15 0348 10/18/15 0422 10/19/15 0511 10/21/15 0335  WBC 10.3 7.6 6.8 9.3 10.5 11.1*  NEUTROABS 8.8*  --   --   --   --   --   HGB 13.1 13.3 13.0 11.5* 11.0* 11.2*  HCT 38.1 38.5 37.5 34.2* 34.4* 35.7*  MCV 87.8 90.6 89.9 88.6 90.5 91.5  PLT 308 331 346 332 364 382   Basic Metabolic Panel:  Recent Labs Lab 10/16/15 1153  10/17/15 0348 10/18/15 0422 10/19/15 0511 10/20/15 0745 10/21/15 0335  NA 138  < > 135 136 139 140 137  K 3.3*  < > 4.0 2.9* 4.1 4.2 4.3  CL 110  < > 108 106 108 109 104  CO2 15*  < > 18* 19* 21* 19* 21*  GLUCOSE 217*  < > 147* 185* 200* 179* 199*  BUN <5*  < > <5* <5* <5* <5* <5*  CREATININE 0.60  < > 0.57 0.44 0.55 0.53 0.62  CALCIUM 6.0*  < > 5.9* 5.8* 6.5* 8.1* 8.9  MG 1.4*  --   --  2.3  --   --   --   < > = values in this interval not displayed. GFR: Estimated Creatinine Clearance: 97.1 mL/min (by C-G formula based on Cr of 0.62). Liver Function Tests:  Recent Labs Lab 10/15/15 2034 10/16/15 1153 10/17/15 0348 10/21/15 0335  AST 20  --  39 20  ALT 19  --  10* 14  ALKPHOS 63  --  48 77  BILITOT 1.8*  --  1.8* 1.1  PROT 6.5  --  5.3* 5.8*  ALBUMIN 3.1* 2.4* 2.1* 1.8*    Recent Labs Lab 10/15/15 2034 10/16/15 0624 10/17/15 0348 10/18/15 0422 10/19/15 0511  LIPASE 942* 1170* 192* 101* 67*    Recent Labs Lab 10/19/15 0732  AMMONIA 22   HbA1C: No results for input(s): HGBA1C in the last 72 hours. CBG:  Recent Labs Lab 10/21/15 0813 10/21/15 1211 10/21/15 1611 10/21/15 2121 10/22/15 0802  GLUCAP 155* 214* 181* 207* 197*   Lipid Profile: No results for input(s): CHOL, HDL, LDLCALC, TRIG, CHOLHDL, LDLDIRECT in the last 72 hours. Thyroid Function Tests: No results for input(s): TSH, T4TOTAL, FREET4, T3FREE, THYROIDAB in the last 72 hours. Anemia Panel: No  results for input(s): VITAMINB12, FOLATE, FERRITIN, TIBC, IRON, RETICCTPCT in the last 72 hours. Urine analysis:    Component Value Date/Time   COLORURINE YELLOW 10/19/2015 0423   APPEARANCEUR CLEAR 10/19/2015 0423   LABSPEC 1.023 10/19/2015 0423   PHURINE 5.5 10/19/2015 0423   GLUCOSEU >1000* 10/19/2015 0423   HGBUR LARGE* 10/19/2015 0423   BILIRUBINUR NEGATIVE 10/19/2015 0423   KETONESUR 15* 10/19/2015 0423   PROTEINUR NEGATIVE 10/19/2015 0423   UROBILINOGEN 0.2 11/05/2008 1339   NITRITE NEGATIVE 10/19/2015 0423   LEUKOCYTESUR NEGATIVE 10/19/2015 0423   Sepsis Labs: Invalid input(s): PROCALCITONIN, LACTICIDVEN  Recent Results (from the past 240 hour(s))  MRSA PCR Screening     Status: None   Collection Time: 10/16/15 10:34 AM  Result Value Ref Range Status   MRSA by PCR NEGATIVE NEGATIVE Final    Comment:        The GeneXpert MRSA Assay (FDA approved for NASAL specimens only), is one component of a comprehensive MRSA colonization surveillance program. It is not intended to diagnose MRSA infection nor to guide or monitor treatment for MRSA infections.       Radiology Studies: Ct Head Wo Contrast  10/21/2015  CLINICAL DATA:  Acute encephalopathy, type II diabetes mellitus EXAM: CT HEAD WITHOUT CONTRAST TECHNIQUE: Contiguous axial images were obtained from the base of the skull through the vertex without intravenous contrast. COMPARISON:  None FINDINGS: Normal ventricular morphology. No midline shift or mass effect. Normal appearance of brain parenchyma. No intracranial hemorrhage, mass lesion or evidence acute infarction. No extra-axial fluid collections. Tiny amounts of fluid at the maxillary sinus apices bilaterally. Bones unremarkable. IMPRESSION: No acute intracranial abnormalities. Electronically Signed   By: Ulyses Southward M.D.   On: 10/21/2015 22:17     Scheduled Meds: . fenofibrate  160 mg Oral Daily  . fluticasone  1 spray Each Nare QHS  . folic acid  1 mg Oral  Daily  . insulin aspart  0-9 Units Subcutaneous TID WC  . insulin glargine  5 Units Subcutaneous Daily  . multivitamin with minerals  1 tablet Oral Daily  . pantoprazole (PROTONIX) IV  40 mg Intravenous Q24H  . prochlorperazine  10 mg Intravenous Once  . sodium chloride flush  3 mL Intravenous Q12H  . thiamine  100 mg Oral Daily   Or  . thiamine  100 mg Intravenous Daily   Continuous Infusions: . sodium chloride 75 mL/hr at 10/21/15 0839    Pamella Pert, MD, PhD [facility] Pager [phone]  If 7PM-7AM, please contact night-coverage [email or web address] Password [email or web address] 10/22/2015, 10:00 AM

## 2015-10-22 NOTE — Progress Notes (Signed)
Inpatient Diabetes Program Recommendations  AACE/ADA: New Consensus Statement on Inpatient Glycemic Control (2015)  Target Ranges:  Prepandial:   less than 140 mg/dL      Peak postprandial:   less than 180 mg/dL (1-2 hours)      Critically ill patients:  140 - 180 mg/dL   Review of Glycemic Control Results for [name], [name] (MRN [id number]) as of 10/22/2015 11:10  Ref. Range 10/21/2015 08:13 10/21/2015 12:11 10/21/2015 16:11 10/21/2015 21:21 10/22/2015 08:02  Glucose-Capillary Latest Ref Range: 65-99 mg/dL 409155 (H) 811214 (H) 914181 (H) 207 (H) 197 (H)   Diabetes history: Type 2 diabetes Outpatient Diabetes medications: Metformin 500 mg AM and 1000 mg PM Current orders for Inpatient glycemic control:  Novolog sensitive tid with meals, Lantus 10 units daily  Inpatient Diabetes Program Recommendations:   Noted increased Lantus to 10 units daily. Please consider increasing Lantus to 20 units daily. Will follow.  Thank you, Billy FischerJudy E. Brennan Litzinger, RN, MSN, CDE Inpatient Glycemic Control Team Team Pager [phone] (8am-5pm) 10/22/2015 11:09 AM

## 2015-10-22 NOTE — Procedures (Signed)
ELECTROENCEPHALOGRAM REPORT  Date of Study: 10/22/2015  Patient's Name: [name] MRN: [id number] Date of Birth: 1966/03/16  Referring Provider: Dr. Arie SabinaKaveer Nandigam  Clinical History: This is a 50 year old woman with altered mental status.  Medications: acetaminophen (TYLENOL) tablet 650 mg fenofibrate tablet 160 mg folic acid (FOLVITE) tablet 1 mg ibuprofen (ADVIL,MOTRIN) tablet 400 mg insulin glargine (LANTUS) injection 10 Units thiamine (B-1) injection 100 mg  Technical Summary: A multichannel digital EEG recording measured by the international 10-20 system with electrodes applied with paste and impedances below 5000 ohms performed as portable with EKG monitoring in an awake and drowsy patient.  Hyperventilation was not performed. Photic stimulation was performed.  The digital EEG was referentially recorded, reformatted, and digitally filtered in a variety of bipolar and referential montages for optimal display.   Description: The patient is awake and drowsy during the recording.  During maximal wakefulness, there is a symmetric, medium voltage 7.5 Hz posterior dominant rhythm that attenuates with eye opening. This is admixed with a moderate amount of diffuse 4-6 Hz theta and 2-3 Hz delta slowing of the waking background. There is additional occasional focal delta slowing seen over the left temporal region.  During drowsiness, there is an increase in theta and delta slowing of the background. Deeper stages of sleep were not seen. Photic stimulation did not elicit any abnormalities.  There were no epileptiform discharges or electrographic seizures seen.    EKG lead showed sinus tachycardia.  Impression: This awake and drowsy EEG is abnormal due to the presence of: 1. Slowing of the posterior dominant rhythm 2. Moderate diffuse slowing of the waking background 3. Additional focal slowing over the left temporal region  Clinical Correlation of the above findings indicates diffuse  cerebral dysfunction that is non-specific in etiology and can be seen with hypoxic/ischemic injury, toxic/metabolic encephalopathies, or medication effect. Focal slowing over the left temporal region indicates focal cerebral dysfunction in this region suggestive of underlying structural or physiologic abnormality. The absence of epileptiform discharges does not rule out a clinical diagnosis of epilepsy.  Clinical correlation is advised.   Patrcia DollyKaren Narcisa Ganesh, M.D.

## 2015-10-22 NOTE — Progress Notes (Signed)
EEG Completed; Results Pending  

## 2015-10-22 NOTE — Consult Note (Signed)
Requesting Physician: Dr.  Elvera LennoxGherghe    Reason for consultation:  To evaluate for encephalopathy  HPI:                                                                                                                                         [name] is an 50 y.o. female patient who  is admitted with severe acute pancreatitis, continues to have altered mental status. Patient unable to provide any history. No family members at bedside. Neurology is consulted to evaluate for any potential etiology for continued altered mental status.   Past Medical History: Past Medical History  Diagnosis Date  . Pancreatitis   . Diabetes mellitus   . Bronchitis   . GERD (gastroesophageal reflux disease)   . Sleep apnea   . Acute pancreatitis 09/30/2010  . Ulcer   . Family history of malignant neoplasm of breast     Past Surgical History  Procedure Laterality Date  . Cesarean section    . Bunionectomy    . Wisdom tooth extraction  1986    Family History: Family History  Problem Relation Age of Onset  . Breast cancer Mother 5757    currently 3776; TAH/BSO ~38yo; only child  . Heart disease Father     SVT  . Diabetes Father   . Colon polyps Father   . Colon cancer Paternal Grandfather     dx 8560s; deceased 1160s  . Diabetes Sister   . Heart disease Sister   . Allergies Son   . Allergies Sister     Social History:   reports that she has never smoked. She has never used smokeless tobacco. She reports that she does not drink alcohol or use illicit drugs.  Allergies:  Allergies  Allergen Reactions  . Erythromycin Other (See Comments)    tachycardia  . Other Other (See Comments)    Oral steroids caused pancreatitis?  . Sulfa Drugs Cross Reactors Rash     Medications:                                                                                                                         Current facility-administered medications:  .  0.9 %  sodium chloride infusion, , Intravenous, Continuous,  Leatha Gildingostin M Gherghe, MD, Last Rate: 75 mL/hr at 10/21/15 0839 .  acetaminophen (TYLENOL) tablet 650 mg, 650 mg, Oral, Q6H PRN **  OR** acetaminophen (TYLENOL) suppository 650 mg, 650 mg, Rectal, Q6H PRN, Michael Litter, MD .  fenofibrate tablet 160 mg, 160 mg, Oral, Daily, Ripudeep K Rai, MD, 160 mg at 10/22/15 0927 .  fentaNYL (SUBLIMAZE) injection 12.5-25 mcg, 12.5-25 mcg, Intravenous, Q8H PRN, Leatha Gilding, MD .  fluticasone (FLONASE) 50 MCG/ACT nasal spray 1 spray, 1 spray, Each Nare, QHS, Michael Litter, MD, 1 spray at 10/21/15 2156 .  folic acid (FOLVITE) tablet 1 mg, 1 mg, Oral, Daily, Ripudeep K Rai, MD, 1 mg at 10/22/15 0927 .  ibuprofen (ADVIL,MOTRIN) tablet 400 mg, 400 mg, Oral, Q6H PRN, Leatha Gilding, MD, 400 mg at 10/22/15 0927 .  insulin aspart (novoLOG) injection 0-9 Units, 0-9 Units, Subcutaneous, TID WC, Costin Otelia Sergeant, MD, 2 Units at 10/22/15 0859 .  insulin glargine (LANTUS) injection 5 Units, 5 Units, Subcutaneous, Daily, Leatha Gilding, MD, 5 Units at 10/22/15 0926 .  LORazepam (ATIVAN) injection 2-3 mg, 2-3 mg, Intravenous, Q1H PRN, Roma Kayser Schorr, NP, 2 mg at 10/21/15 2156 .  multivitamin with minerals tablet 1 tablet, 1 tablet, Oral, Daily, Ripudeep K Rai, MD, 1 tablet at 10/22/15 0927 .  ondansetron (ZOFRAN) tablet 4 mg, 4 mg, Oral, Q6H PRN **OR** ondansetron (ZOFRAN) injection 4 mg, 4 mg, Intravenous, Q6H PRN, Michael Litter, MD, 4 mg at 10/20/15 0227 .  pantoprazole (PROTONIX) injection 40 mg, 40 mg, Intravenous, Q24H, Michael Litter, MD, 40 mg at 10/22/15 0100 .  polyethylene glycol (MIRALAX / GLYCOLAX) packet 17 g, 17 g, Oral, Daily PRN, Ripudeep K Rai, MD .  prochlorperazine (COMPAZINE) injection 10 mg, 10 mg, Intravenous, Once, Leanne Chang, NP, Stopped at 10/17/15 0244 .  senna-docusate (Senokot-S) tablet 1 tablet, 1 tablet, Oral, QHS PRN, Leatha Gilding, MD .  sodium chloride flush (NS) 0.9 % injection 3 mL, 3 mL, Intravenous, Q12H, Michael Litter, MD, 3 mL at  10/20/15 2100 .  thiamine (VITAMIN B-1) tablet 100 mg, 100 mg, Oral, Daily, 100 mg at 10/22/15 5409 **OR** thiamine (B-1) injection 100 mg, 100 mg, Intravenous, Daily, Ripudeep K Rai, MD   ROS:                                                                                                                                       History  unobtainable from patient due to mental status   Neurologic Examination:  Today's Vitals   10/22/15 0145 10/22/15 0401 10/22/15 0403 10/22/15 0801  BP:   111/72 130/89  Pulse:      Temp:  97.8 F (36.6 C)  98.3 F (36.8 C)  TempSrc:  Oral  Oral  Resp:   25 29  Height:      Weight:      SpO2:   98%   PainSc: 4   0-No pain     Patient in a drowsy, arousable with tactile stimulus. No specific gaze deviation or preference, no neglect. Follow simple commands. Able to elevate all 4 extremities antigravity to command. No abnormal involuntary movements noted.        Lab Results: Basic Metabolic Panel:  Recent Labs Lab 10/16/15 1153  10/17/15 0348 10/18/15 0422 10/19/15 0511 10/20/15 0745 10/21/15 0335  NA 138  < > 135 136 139 140 137  K 3.3*  < > 4.0 2.9* 4.1 4.2 4.3  CL 110  < > 108 106 108 109 104  CO2 15*  < > 18* 19* 21* 19* 21*  GLUCOSE 217*  < > 147* 185* 200* 179* 199*  BUN <5*  < > <5* <5* <5* <5* <5*  CREATININE 0.60  < > 0.57 0.44 0.55 0.53 0.62  CALCIUM 6.0*  < > 5.9* 5.8* 6.5* 8.1* 8.9  MG 1.4*  --   --  2.3  --   --   --   < > = values in this interval not displayed.  Liver Function Tests:  Recent Labs Lab 10/15/15 2034 10/16/15 1153 10/17/15 0348 10/21/15 0335  AST 20  --  39 20  ALT 19  --  10* 14  ALKPHOS 63  --  48 77  BILITOT 1.8*  --  1.8* 1.1  PROT 6.5  --  5.3* 5.8*  ALBUMIN 3.1* 2.4* 2.1* 1.8*    Recent Labs Lab 10/15/15 2034 10/16/15 0624 10/17/15 0348 10/18/15 0422 10/19/15 0511  LIPASE 942* 1170*  192* 101* 67*    Recent Labs Lab 10/19/15 0732  AMMONIA 22    CBC:  Recent Labs Lab 10/15/15 2034 10/16/15 0624 10/17/15 0348 10/18/15 0422 10/19/15 0511 10/21/15 0335  WBC 10.3 7.6 6.8 9.3 10.5 11.1*  NEUTROABS 8.8*  --   --   --   --   --   HGB 13.1 13.3 13.0 11.5* 11.0* 11.2*  HCT 38.1 38.5 37.5 34.2* 34.4* 35.7*  MCV 87.8 90.6 89.9 88.6 90.5 91.5  PLT 308 331 346 332 364 382    Cardiac Enzymes: No results for input(s): CKTOTAL, CKMB, CKMBINDEX, TROPONINI in the last 168 hours.  Lipid Panel:  Recent Labs Lab 10/16/15 0624 10/18/15 1159  CHOL 801* 294*  TRIG >5000* 1018*  HDL NOT REPORTED DUE TO HIGH TRIGLYCERIDES 17*  CHOLHDL NOT REPORTED DUE TO HIGH TRIGLYCERIDES 17.3  VLDL UNABLE TO CALCULATE IF TRIGLYCERIDE OVER 400 mg/dL UNABLE TO CALCULATE IF TRIGLYCERIDE OVER 400 mg/dL  LDLCALC UNABLE TO CALCULATE IF TRIGLYCERIDE OVER 400 mg/dL UNABLE TO CALCULATE IF TRIGLYCERIDE OVER 400 mg/dL    CBG:  Recent Labs Lab 10/21/15 0813 10/21/15 1211 10/21/15 1611 10/21/15 2121 10/22/15 0802  GLUCAP 155* 214* 181* 207* 197*    Microbiology: Results for orders placed or performed during the hospital encounter of 10/15/15  MRSA PCR Screening     Status: None   Collection Time: 10/16/15 10:34 AM  Result Value Ref Range Status   MRSA by PCR NEGATIVE NEGATIVE Final    Comment:  The GeneXpert MRSA Assay (FDA approved for NASAL specimens only), is one component of a comprehensive MRSA colonization surveillance program. It is not intended to diagnose MRSA infection nor to guide or monitor treatment for MRSA infections.      Imaging: Ct Head Wo Contrast  10/21/2015  CLINICAL DATA:  Acute encephalopathy, type II diabetes mellitus EXAM: CT HEAD WITHOUT CONTRAST TECHNIQUE: Contiguous axial images were obtained from the base of the skull through the vertex without intravenous contrast. COMPARISON:  None FINDINGS: Normal ventricular morphology. No midline  shift or mass effect. Normal appearance of brain parenchyma. No intracranial hemorrhage, mass lesion or evidence acute infarction. No extra-axial fluid collections. Tiny amounts of fluid at the maxillary sinus apices bilaterally. Bones unremarkable. IMPRESSION: No acute intracranial abnormalities. Electronically Signed   By: Ulyses Southward M.D.   On: 10/21/2015 22:17   Dg Abd Portable 2v  10/20/2015  CLINICAL DATA:  Acute pancreatitis. EXAM: PORTABLE ABDOMEN - 2 VIEW COMPARISON:  CT scan of October 16, 2015. FINDINGS: The bowel gas pattern is normal. There is no evidence of free air. Phleboliths are noted in the pelvis. IMPRESSION: No definite evidence of bowel obstruction or ileus. Electronically Signed   By: Lupita Raider, M.D.   On: 10/20/2015 07:54       Assessment and plan:   [name] is an 50 y.o. female patient who Is admitted with severe acute pancreatitis, continues to have altered mental status likely metabolic in etiology. Recommend an EEG and MRI of the brain for further neurodiagnostic evaluation. EEG was completed, showed evidence of generalized encephalopathy with some additional focal slowing in the left temporal region. Hence MRI of the brain will be helpful to evaluate for intracranial pathology. We'll follow-up after completing the brain MRI study.

## 2015-10-23 ENCOUNTER — Inpatient Hospital Stay (HOSPITAL_COMMUNITY): Payer: BLUE CROSS/BLUE SHIELD

## 2015-10-23 LAB — GLUCOSE, CAPILLARY
GLUCOSE-CAPILLARY: 201 mg/dL — AB (ref 65–99)
GLUCOSE-CAPILLARY: 198 mg/dL — AB (ref 65–99)
Glucose-Capillary: 217 mg/dL — ABNORMAL HIGH (ref 65–99)
Glucose-Capillary: 188 mg/dL — ABNORMAL HIGH (ref 65–99)

## 2015-10-23 NOTE — Progress Notes (Signed)
Pt has refused to even leave her room for MRI twice now. Once on Friday and again this morning. Please discuss with pt and let MRI know when we can be of service to her. I realize pt is confused but if she wont leave the room she surely wont comply with the requirements of the exam.

## 2015-10-23 NOTE — Progress Notes (Signed)
PROGRESS NOTE  [name] ZOX:[id number] DOB: 09/05/1965 DOA: 10/15/2015 PCP: Hoyle Sauer, MD Outpatient Specialists:    LOS: 8 days   Brief Narrative: [name] is a 50 y.o. woman with a history of Type 2 DM (level of control unknown; treated with metformin), GERD, and recurrent pancreatitis (2010, 2012, 2014) who was admitted on 4/14 with acute pancreatitis. She was doing well for the first couple days, however on 4/18 she has been more encephalopathic.   Assessment & Plan: Principal Problem:   Acute pancreatitis Active Problems:   Diabetes mellitus, type 2 (HCC)   DKA (diabetic ketoacidoses) (HCC)   Hypertriglyceridemia   Acute recurrent pancreatitis: Likely due to severe hypertriglyceridemia - CT abdomen consistent with acute pancreatitis - Lipid panel showed triglycerides over 5000, Continue TriCor - Continue NPO due to encephalopathy and ongoing abdominal pain - Lipids panel showed improving triglycerides to 1018, repeat 199 - lipase now normal - advance diet  Acute encephalopathy unclear possibly due to narcotics - She continues to be encephalopathic today, however with mild improvement - husband confirms that she does not drink and they have no alcohol drinks in the house. Hence alcohol withdrawal less likely. Patient's husband strongly thinks it's because of her pain medications, patient does not take narcotics at home - Dilaudid and oxycodone have been discontinued on 4/18. Placed on fentanyl low dose, discontinued on 4/22 - neurology consulted, appreciate input  Hyponatremia, hypocalcemia: Improving - Likely due to profound dehydration, acute pancreatitis and DKA  Severe Hypertriglyceridemia - Placed on Tricor, will also place on low-dose statin at discharge.  - Rule out familial hypertriglyceridemia, recommended patient to follow with endocrinology outpatient - TG 199 now  DKA, anion gap of 21 with bicarb of 11 and ketones in urine - Gap has closed  bicarbonate 21, however patient has severe pancreatitis and NPO - was on dextrose and IV insulin glucose stabilizer - Continue to hold metformin - She will likely need insulin at home  Diarrhea - resolved   DVT prophylaxis: SCD Code Status: Full code Family Communication: no family at bedside today  Disposition Plan: home when ready  Barriers for discharge: acute encephalopathy   Consultants:   Neurology   Procedures:   None   Antimicrobials:  None    Subjective: - A x O to place and person, crying when I entered her room  Objective: Filed Vitals:   10/22/15 1633 10/22/15 1955 10/22/15 2054 10/23/15 0430  BP: 155/82 115/96 143/87 135/82  Pulse: 111 114 112 104  Temp: 98.4 F (36.9 C) 98.5 F (36.9 C) 98.3 F (36.8 C) 98.2 F (36.8 C)  TempSrc: Oral Oral Oral Oral  Resp: 35 Height:    (1.651 m)   Weight:   91.128 kg (200 lb 14.4 oz) 91.173 kg (201 lb)  SpO2: 94% 94% 93% 95%    Intake/Output Summary (Last 24 hours) at 10/23/15 1123 Last data filed at 10/23/15 0743  Gross per 24 hour  Intake 1622.5 ml  Output   1320 ml  Net  302.5 ml   Filed Weights   10/16/15 1200 10/22/15 2054 10/23/15 0430  Weight: 95.301 kg (210 lb 1.6 oz) 91.128 kg (200 lb 14.4 oz) 91.173 kg (201 lb)    Examination: Constitutional: appears anxious  Filed Vitals:   10/22/15 1633 10/22/15 1955 10/22/15 2054 10/23/15 0430  BP: 155/82 115/96 143/87 135/82  Pulse: 111 114 112 104  Temp: 98.4 F (36.9 C) 98.5 F (36.9 C) 98.3 F (  36.8 C) 98.2 F (36.8 C)  TempSrc: Oral Oral Oral Oral  Resp: 35 Height:    (1.651 m)   Weight:   91.128 kg (200 lb 14.4 oz) 91.173 kg (201 lb)  SpO2: 94% 94% 93% 95%   Eyes: PERRL ENMT: Mucous membranes are moist.  Respiratory: clear to auscultation bilaterally, no wheezing, no crackles.  Cardiovascular: Regular rate and rhythm, no murmurs / rubs / gallops. No extremity edema. 2+ pedal pulses. Tachycardic Abdomen:  minimal tenderness to palpation midepigastric area, Bowel sounds positive.   Data Reviewed: I have personally reviewed following labs and imaging studies  CBC:  Recent Labs Lab 10/17/15 0348 10/18/15 0422 10/19/15 0511 10/21/15 0335  WBC 6.8 9.3 10.5 11.1*  HGB 13.0 11.5* 11.0* 11.2*  HCT 37.5 34.2* 34.4* 35.7*  MCV 89.9 88.6 90.5 91.5  PLT 346 332 364 382   Basic Metabolic Panel:  Recent Labs Lab 10/16/15 1153  10/18/15 0422 10/19/15 0511 10/20/15 0745 10/21/15 0335 10/22/15 1200  NA 138  < > 136 139 140 137 138  K 3.3*  < > 2.9* 4.1 4.2 4.3 4.0  CL 110  < > 106 108 109 104 101  CO2 15*  < > 19* 21* 19* 21* 24  GLUCOSE 217*  < > 185* 200* 179* 199* 268*  BUN <5*  < > <5* <5* <5* <5* <5*  CREATININE 0.60  < > 0.44 0.55 0.53 0.62 0.65  CALCIUM 6.0*  < > 5.8* 6.5* 8.1* 8.9 8.9  MG 1.4*  --  2.3  --   --   --   --   < > = values in this interval not displayed. GFR: Estimated Creatinine Clearance: 94.9 mL/min (by C-G formula based on Cr of 0.65). Liver Function Tests:  Recent Labs Lab 10/16/15 1153 10/17/15 0348 10/21/15 0335 10/22/15 1200  AST  --  39 20 17  ALT  --  10* 14 16  ALKPHOS  --  48 77 65  BILITOT  --  1.8* 1.1 1.2  PROT  --  5.3* 5.8* 5.5*  ALBUMIN 2.4* 2.1* 1.8* 1.8*    Recent Labs Lab 10/17/15 0348 10/18/15 0422 10/19/15 0511 10/22/15 1200  LIPASE 192* 101* 67* 41    Recent Labs Lab 10/19/15 0732 10/22/15 1200  AMMONIA 22 17   HbA1C: No results for input(s): HGBA1C in the last 72 hours. CBG:  Recent Labs Lab 10/22/15 0802 10/22/15 1219 10/22/15 1641 10/22/15 2157 10/23/15 0603  GLUCAP 197* 223* 210* 234* 201*   Lipid Profile:  Recent Labs  10/22/15 1200  TRIG 199*   Thyroid Function Tests:  Recent Labs  10/22/15 1200  TSH 1.076   Anemia Panel: No results for input(s): VITAMINB12, FOLATE, FERRITIN, TIBC, IRON, RETICCTPCT in the last 72 hours. Urine analysis:    Component Value Date/Time   COLORURINE  YELLOW 10/19/2015 0423   APPEARANCEUR CLEAR 10/19/2015 0423   LABSPEC 1.023 10/19/2015 0423   PHURINE 5.5 10/19/2015 0423   GLUCOSEU >1000* 10/19/2015 0423   HGBUR LARGE* 10/19/2015 0423   BILIRUBINUR NEGATIVE 10/19/2015 0423   KETONESUR 15* 10/19/2015 0423   PROTEINUR NEGATIVE 10/19/2015 0423   UROBILINOGEN 0.2 11/05/2008 1339   NITRITE NEGATIVE 10/19/2015 0423   LEUKOCYTESUR NEGATIVE 10/19/2015 0423   Sepsis Labs: Invalid input(s): PROCALCITONIN, LACTICIDVEN  Recent Results (from the past 240 hour(s))  MRSA PCR Screening     Status: None   Collection Time: 10/16/15 10:34 AM  Result Value Ref  Range Status   MRSA by PCR NEGATIVE NEGATIVE Final    Comment:        The GeneXpert MRSA Assay (FDA approved for NASAL specimens only), is one component of a comprehensive MRSA colonization surveillance program. It is not intended to diagnose MRSA infection nor to guide or monitor treatment for MRSA infections.       Radiology Studies: Ct Head Wo Contrast  10/21/2015  CLINICAL DATA:  Acute encephalopathy, type II diabetes mellitus EXAM: CT HEAD WITHOUT CONTRAST TECHNIQUE: Contiguous axial images were obtained from the base of the skull through the vertex without intravenous contrast. COMPARISON:  None FINDINGS: Normal ventricular morphology. No midline shift or mass effect. Normal appearance of brain parenchyma. No intracranial hemorrhage, mass lesion or evidence acute infarction. No extra-axial fluid collections. Tiny amounts of fluid at the maxillary sinus apices bilaterally. Bones unremarkable. IMPRESSION: No acute intracranial abnormalities. Electronically Signed   By: Ulyses SouthwardMark  Boles M.D.   On: 10/21/2015 22:17     Scheduled Meds: . fenofibrate  160 mg Oral Daily  . fluticasone  1 spray Each Nare QHS  . folic acid  1 mg Oral Daily  . insulin aspart  0-9 Units Subcutaneous TID WC  . insulin glargine  10 Units Subcutaneous Daily  . multivitamin with minerals  1 tablet Oral Daily    . pantoprazole (PROTONIX) IV  40 mg Intravenous Q24H  . prochlorperazine  10 mg Intravenous Once  . sodium chloride flush  3 mL Intravenous Q12H  . thiamine  100 mg Oral Daily   Or  . thiamine  100 mg Intravenous Daily   Continuous Infusions: . sodium chloride 75 mL/hr at 10/23/15 0237    Pamella Pertostin Montavious Wierzba, MD, PhD [facility] Pager [phone]  If 7PM-7AM, please contact night-coverage [email or web address] Password [email or web address] 10/23/2015, 11:23 AM

## 2015-10-24 LAB — BASIC METABOLIC PANEL
ANION GAP: 15 (ref 5–15)
BUN: <5 mg/dL — ABNORMAL LOW (ref 6–20)
CHLORIDE: 99 mmol/L — AB (ref 101–111)
CO2: 25 mmol/L (ref 22–32)
Calcium: 9 mg/dL (ref 8.9–10.3)
Creatinine, Ser: 0.55 mg/dL (ref 0.44–1.00)
Glucose, Bld: 222 mg/dL — ABNORMAL HIGH (ref 65–99)
POTASSIUM: 3.3 mmol/L — AB (ref 3.5–5.1)
SODIUM: 139 mmol/L (ref 135–145)

## 2015-10-24 LAB — GLUCOSE, CAPILLARY
GLUCOSE-CAPILLARY: 196 mg/dL — AB (ref 65–99)
GLUCOSE-CAPILLARY: 248 mg/dL — AB (ref 65–99)
GLUCOSE-CAPILLARY: 220 mg/dL — AB (ref 65–99)
GLUCOSE-CAPILLARY: 235 mg/dL — AB (ref 65–99)

## 2015-10-24 MED ORDER — SENNOSIDES-DOCUSATE SODIUM 8.6-50 MG PO TABS
2.0000 | ORAL_TABLET | Freq: Two times a day (BID) | ORAL | Status: DC
  Administered 2015-10-24 – 2015-10-25 (×4): 2 via ORAL
  Filled 2015-10-24 (×5): qty 2

## 2015-10-24 MED ORDER — KETOROLAC TROMETHAMINE 15 MG/ML IJ SOLN
15.0000 mg | Freq: Three times a day (TID) | INTRAMUSCULAR | Status: DC | PRN
  Administered 2015-10-24 – 2015-10-25 (×4): 15 mg via INTRAVENOUS
  Filled 2015-10-24 (×8): qty 1

## 2015-10-24 NOTE — Progress Notes (Signed)
PROGRESS NOTE  [name] ZOX:[id number] DOB: 12-24-1965 DOA: 10/15/2015 PCP: Hoyle Sauer, MD Outpatient Specialists:    LOS: 9 days   Brief Narrative: [name] is a 50 y.o. woman with a history of Type 2 DM (level of control unknown; treated with metformin), GERD, and recurrent pancreatitis (2010, 2012, 2014) who was admitted on 4/14 with acute pancreatitis. She was doing well for the first couple days, however on 4/18 she has been more encephalopathic.   Assessment & Plan: Principal Problem:   Acute pancreatitis Active Problems:   Diabetes mellitus, type 2 (HCC)   DKA (diabetic ketoacidoses) (HCC)   Hypertriglyceridemia   Acute recurrent pancreatitis: Likely due to severe hypertriglyceridemia - CT abdomen consistent with acute pancreatitis - Lipid panel on admission showed triglycerides over 5000, Continue TriCor - Lipids panel showed improving triglycerides to 1018, repeat 199 - lipase now normal - advance diet to heart healthy today.  Acute encephalopathy unclear possibly due to narcotics - She continues to be encephalopathic today, however with mild improvement - husband confirms that she does not drink and they have no alcohol drinks in the house. Hence alcohol withdrawal less likely. Patient's husband strongly thinks it's because of her pain medications, patient does not take narcotics at home - Dilaudid and oxycodone have been discontinued on 4/18. Placed on fentanyl low dose, discontinued on 4/22 - neurology consulted, appreciate input - clearing more this morning, now AxOx3  Hyponatremia, hypocalcemia: Improving - Likely due to profound dehydration, acute pancreatitis and DKA  Severe Hypertriglyceridemia - Placed on Tricor, will also place on low-dose statin at discharge.  - Rule out familial hypertriglyceridemia, recommended patient to follow with endocrinology outpatient - TG 199   DKA, anion gap of 21 with bicarb of 11 and ketones in urine - Gap has  closed bicarbonate 21, however patient has severe pancreatitis and NPO - was on dextrose and IV insulin glucose stabilizer - Continue to hold metformin - She will likely need insulin at home  Diarrhea - resolved   DVT prophylaxis: SCD Code Status: Full code Family Communication: no family at bedside today, discussed with her husband over the phone Disposition Plan: home when ready  Barriers for discharge: acute encephalopathy   Consultants:   Neurology   Procedures:   None   Antimicrobials:  None    Subjective: - calm, AxOx4, appears depressed  Objective: Filed Vitals:   10/23/15 1258 10/23/15 2100 10/24/15 0500 10/24/15 1256  BP: 141/84 122/69 140/56 113/88  Pulse: 112 103 98 129  Temp: 98.2 F (36.8 C) 98.5 F (36.9 C) 98 F (36.7 C) 98.4 F (36.9 C)  TempSrc: Oral Oral Oral Oral  Resp: Height:      Weight:   90.992 kg (200 lb 9.6 oz)   SpO2: 94% 93% 94% 93%    Intake/Output Summary (Last 24 hours) at 10/24/15 1302 Last data filed at 10/24/15 1211  Gross per 24 hour  Intake   2880 ml  Output   1725 ml  Net   1155 ml   Filed Weights   10/22/15 2054 10/23/15 0430 10/24/15 0500  Weight: 91.128 kg (200 lb 14.4 oz) 91.173 kg (201 lb) 90.992 kg (200 lb 9.6 oz)    Examination: Constitutional: NAD  Filed Vitals:   10/23/15 1258 10/23/15 2100 10/24/15 0500 10/24/15 1256  BP: 141/84 122/69 140/56 113/88  Pulse: 112 103 98 129  Temp: 98.2 F (36.8 C) 98.5 F (36.9 C) 98 F (36.7 C) 98.4 F (  36.9 C)  TempSrc: Oral Oral Oral Oral  Resp: 20 20 20 20   Height:      Weight:   90.992 kg (200 lb 9.6 oz)   SpO2: 94% 93% 94% 93%   Eyes: PERRL ENMT: Mucous membranes are moist.  Respiratory: clear to auscultation bilaterally, no wheezing, no crackles.  Cardiovascular: Regular rate and rhythm, no murmurs / rubs / gallops. No extremity edema. 2+ pedal pulses. Tachycardic Abdomen: minimal tenderness to palpation midepigastric area, Bowel sounds  positive.   Data Reviewed: I have personally reviewed following labs and imaging studies  CBC:  Recent Labs Lab 10/18/15 0422 10/19/15 0511 10/21/15 0335  WBC 9.3 10.5 11.1*  HGB 11.5* 11.0* 11.2*  HCT 34.2* 34.4* 35.7*  MCV 88.6 90.5 91.5  PLT 332 364 382   Basic Metabolic Panel:  Recent Labs Lab 10/18/15 0422 10/19/15 0511 10/20/15 0745 10/21/15 0335 10/22/15 1200 10/24/15 0316  NA 136 139 140 137 138 139  K 2.9* 4.1 4.2 4.3 4.0 3.3*  CL 106 108 109 104 101 99*  CO2 19* 21* 19* 21* 24 25  GLUCOSE 185* 200* 179* 199* 268* 222*  BUN <5* <5* <5* <5* <5* <5*  CREATININE 0.44 0.55 0.53 0.62 0.65 0.55  CALCIUM 5.8* 6.5* 8.1* 8.9 8.9 9.0  MG 2.3  --   --   --   --   --    GFR: Estimated Creatinine Clearance: 94.8 mL/min (by C-G formula based on Cr of 0.55). Liver Function Tests:  Recent Labs Lab 10/21/15 0335 10/22/15 1200  AST 20 17  ALT 14 16  ALKPHOS 77 65  BILITOT 1.1 1.2  PROT 5.8* 5.5*  ALBUMIN 1.8* 1.8*    Recent Labs Lab 10/18/15 0422 10/19/15 0511 10/22/15 1200  LIPASE 101* 67* 41    Recent Labs Lab 10/19/15 0732 10/22/15 1200  AMMONIA 22 17   HbA1C: No results for input(s): HGBA1C in the last 72 hours. CBG:  Recent Labs Lab 10/23/15 1132 10/23/15 1621 10/23/15 2136 10/24/15 0620 10/24/15 1138  GLUCAP 198* 217* 188* 196* 248*   Lipid Profile:  Recent Labs  10/22/15 1200  TRIG 199*   Thyroid Function Tests:  Recent Labs  10/22/15 1200  TSH 1.076   Anemia Panel: No results for input(s): VITAMINB12, FOLATE, FERRITIN, TIBC, IRON, RETICCTPCT in the last 72 hours. Urine analysis:    Component Value Date/Time   COLORURINE YELLOW 10/19/2015 0423   APPEARANCEUR CLEAR 10/19/2015 0423   LABSPEC 1.023 10/19/2015 0423   PHURINE 5.5 10/19/2015 0423   GLUCOSEU >1000* 10/19/2015 0423   HGBUR LARGE* 10/19/2015 0423   BILIRUBINUR NEGATIVE 10/19/2015 0423   KETONESUR 15* 10/19/2015 0423   PROTEINUR NEGATIVE 10/19/2015 0423    UROBILINOGEN 0.2 11/05/2008 1339   NITRITE NEGATIVE 10/19/2015 0423   LEUKOCYTESUR NEGATIVE 10/19/2015 0423   Sepsis Labs: Invalid input(s): PROCALCITONIN, LACTICIDVEN  Recent Results (from the past 240 hour(s))  MRSA PCR Screening     Status: None   Collection Time: 10/16/15 10:34 AM  Result Value Ref Range Status   MRSA by PCR NEGATIVE NEGATIVE Final    Comment:        The GeneXpert MRSA Assay (FDA approved for NASAL specimens only), is one component of a comprehensive MRSA colonization surveillance program. It is not intended to diagnose MRSA infection nor to guide or monitor treatment for MRSA infections.     Radiology Studies: No results found.  Scheduled Meds: . fenofibrate  160 mg Oral Daily  . fluticasone  1 spray Each Nare QHS  . folic acid  1 mg Oral Daily  . insulin aspart  0-9 Units Subcutaneous TID WC  . insulin glargine  10 Units Subcutaneous Daily  . multivitamin with minerals  1 tablet Oral Daily  . pantoprazole (PROTONIX) IV  40 mg Intravenous Q24H  . prochlorperazine  10 mg Intravenous Once  . senna-docusate  2 tablet Oral BID  . sodium chloride flush  3 mL Intravenous Q12H  . thiamine  100 mg Oral Daily   Or  . thiamine  100 mg Intravenous Daily   Continuous Infusions: . sodium chloride 75 mL/hr at 10/24/15 1000    Pamella Pert, MD, PhD [facility] Pager [phone]  If 7PM-7AM, please contact night-coverage [email or web address] Password [email or web address] 10/24/2015, 1:02 PM

## 2015-10-25 DIAGNOSIS — G9340 Encephalopathy, unspecified: Secondary | ICD-10-CM | POA: Insufficient documentation

## 2015-10-25 LAB — GLUCOSE, CAPILLARY
GLUCOSE-CAPILLARY: 211 mg/dL — AB (ref 65–99)
Glucose-Capillary: 249 mg/dL — ABNORMAL HIGH (ref 65–99)
Glucose-Capillary: 257 mg/dL — ABNORMAL HIGH (ref 65–99)
Glucose-Capillary: 261 mg/dL — ABNORMAL HIGH (ref 65–99)

## 2015-10-25 MED ORDER — DICLOFENAC EPOLAMINE 1.3 % TD PTCH
1.0000 | MEDICATED_PATCH | Freq: Two times a day (BID) | TRANSDERMAL | Status: DC
Start: 2015-10-25 — End: 2015-10-26
  Administered 2015-10-25 – 2015-10-26 (×2): 1 via TRANSDERMAL
  Filled 2015-10-25 (×3): qty 1

## 2015-10-25 MED ORDER — INSULIN GLARGINE 100 UNIT/ML ~~LOC~~ SOLN: 15.0000 [IU] | Freq: Every day | SUBCUTANEOUS | Status: DC

## 2015-10-25 MED ORDER — PANTOPRAZOLE SODIUM 40 MG PO TBEC
40.0000 mg | DELAYED_RELEASE_TABLET | Freq: Every day | ORAL | Status: DC
  Administered 2015-10-25: 40 mg via ORAL
  Filled 2015-10-25: qty 1

## 2015-10-25 MED ORDER — INSULIN NPH (HUMAN) (ISOPHANE) 100 UNIT/ML ~~LOC~~ SUSP
8.0000 [IU] | Freq: Two times a day (BID) | SUBCUTANEOUS | Status: DC
Start: 2015-10-25 — End: 2015-10-26
  Administered 2015-10-25 – 2015-10-26 (×2): 8 [IU] via SUBCUTANEOUS
  Filled 2015-10-25: qty 10

## 2015-10-25 MED ORDER — INSULIN ASPART 100 UNIT/ML ~~LOC~~ SOLN
2.0000 [IU] | Freq: Three times a day (TID) | SUBCUTANEOUS | Status: DC
  Administered 2015-10-25 – 2015-10-26 (×3): 2 [IU] via SUBCUTANEOUS

## 2015-10-25 NOTE — Care Management Note (Addendum)
Case Management Note  Patient Details  Name: [name] MRN: [id number] Date of Birth: 11-30-1965  Subjective/Objective:     Pt admitted with abdominal pain               Action/Plan:  Pt is from home with husband.   Attending ask CM to obtain benefit check for   S[employer]W [employer] @ [employer] # [phone]    LANTUS 15 UNITS ( PEN ) TID   COVER - YES  CO-PAY -$ 373. PEN 5 VAIL  PRIOR APPROVAL-NO  *BASAGLER 15 UNITS QUICK-PEN $ 317.63 MO   3-4 UNITS OF NOVOLOG TID   COVER- YES  CO-PAY- $ 532.78 FLEX -PEN  AND VAIL X 2 $ 551.72  PRIOR APPROVAL- NO  PHARMACY : [facility]       Previous Messages        Due to extreme hardship; per Attending pt will discharge home on 70/30.  CM will provide pt $25 copay card.    Expected Discharge Date:                  Expected Discharge Plan:  Home/Self Care  In-House Referral:     Discharge planning Services  CM Consult, Medication Assistance  Post Acute Care Choice:    Choice offered to:     DME Arranged:    DME Agency:     HH Arranged:    HH Agency:     Status of Service:  In process, will continue to follow  Medicare Important Message Given:    Date Medicare IM Given:    Medicare IM give by:    Date Additional Medicare IM Given:    Additional Medicare Important Message give by:     If discussed at Long Length of Stay Meetings, dates discussed:    Additional Comments: Pt refused outpt PT at this time, CM informed pt that she could follow up with PCP if she determines that she will like to perdue therapy in the future. Cherylann ParrClaxton, Ayaka Andes S, RN 10/25/2015, 4:33 PM

## 2015-10-25 NOTE — Progress Notes (Signed)
Subjective: No further complaints, back to baseline.   Exam: Filed Vitals:   10/25/15 0948 10/25/15 1154  BP: 134/67 144/92  Pulse: 205 105  Temp:  98.1 F (36.7 C)  Resp: 16 18   Gen: In bed, NAD Resp: non-labored breathing, no acute distress Abd: soft, nt  Neuro: MS: awake, alert, oriented. Able to spell world backwards.   Pertinent Labs: Bmp - elevated glucose.   Impression: 50 yo F with delirium in the setting of pancreatitis and high dose narcotics. I suspec that tthis was a toxic/metabolic delirium. The focal findings on EEG are of unclear significance. The EEG was abnormal and in this setting I would typically obtain an MRI, however, with her claustrophobia and need for general anesthesia, I think that a CT is adequate for now. If she has any further symptoms in the future, then would obtain an MRI.    Recommendations: 1) No further recs at this time. Please call with further questions.   Ritta SlotMcNeill Deward Sebek, MD [facility] [phone]  If 7pm- 7am, please page neurology on call as listed in AMION.

## 2015-10-25 NOTE — Progress Notes (Signed)
PROGRESS NOTE  [name] ZDG:[id number] DOB: Nov 15, 1965 DOA: 10/15/2015 PCP: Hoyle Sauer, MD Outpatient Specialists:    LOS: 10 days   Brief Narrative: [name] is a 50 y.o. woman with a history of Type 2 DM (level of control unknown; treated with metformin), GERD, and recurrent pancreatitis (2010, 2012, 2014) who was admitted on 4/14 with acute pancreatitis. She was doing well for the first couple days, however on 4/18 she has been more encephalopathic.   Assessment & Plan: Principal Problem:   Acute pancreatitis Active Problems:   Diabetes mellitus, type 2 (HCC)   DKA (diabetic ketoacidoses) (HCC)   Hypertriglyceridemia   Acute recurrent pancreatitis: Likely due to severe hypertriglyceridemia - CT abdomen consistent with acute pancreatitis - Lipid panel on admission showed triglycerides over 5000, Continue TriCor - Lipids panel showed improving triglycerides to 1018, repeat 199 - lipase now normal - tolerating regular diet  Acute encephalopathy unclear possibly due to narcotics - She continues to be encephalopathic today, however with mild improvement - husband confirms that she does not drink and they have no alcohol drinks in the house. Hence alcohol withdrawal less likely. Patient's husband strongly thinks it's because of her pain medications, patient does not take narcotics at home - Dilaudid and oxycodone have been discontinued on 4/18. Placed on fentanyl low dose, discontinued on 4/22 - neurology consulted, appreciate input - back to baseline, avoid all narcotics in the future  Hyponatremia, hypocalcemia: Improving - Likely due to profound dehydration, acute pancreatitis and DKA  Severe Hypertriglyceridemia - Placed on Tricor, will also place on low-dose statin at discharge.  - Rule out familial hypertriglyceridemia, recommended patient to follow with endocrinology outpatient - TG 199   Poorly controlled DM with DKA, anion gap of 21 with bicarb of 11 and  ketones in urine on admission - Gap has closed bicarbonate 21, however patient has severe pancreatitis and NPO - was on dextrose and IV insulin glucose stabilizer - Continue to hold metformin  Long discussion with patient and CM bedside, multiple questions were answered. It appears that copay for Lantus + Novolog is ~$ 800, whereas 70/30 is $25 a month. 70/30 is suboptimal however due to insurance and cost per patient preference will go with 70/30. In preparation I stopped her Lantus and started NPH 8 U BID. If CBGs reasonable in am can go home.   Diarrhea - resolved  Back pain - likely MSK, NSAIDs, diclofenac patch  DVT prophylaxis: SCD Code Status: Full code Family Communication: d/w husband bedside Disposition Plan: home when ready  Barriers for discharge: acute encephalopathy   Consultants:   Neurology   Procedures:   None   Antimicrobials:  None    Subjective: - smiling, cooperative, AxOx4, complains of back pain  Objective: Filed Vitals:   10/24/15 1952 10/25/15 0553 10/25/15 0948 10/25/15 1154  BP: 122/67 134/94 134/67 144/92  Pulse: 103 101 205 105  Temp: 98.6 F (37 C) 98.8 F (37.1 C)  98.1 F (36.7 C)  TempSrc:  Oral  Oral  Resp: Height:      Weight:  89.903 kg (198 lb 3.2 oz)    SpO2: 95% 93% 95% 95%    Intake/Output Summary (Last 24 hours) at 10/25/15 1649 Last data filed at 10/25/15 1329  Gross per 24 hour  Intake    762 ml  Output    551 ml  Net    211 ml   Filed Weights   10/23/15 0430 10/24/15 0500 10/25/15  0553  Weight: 91.173 kg (201 lb) 90.992 kg (200 lb 9.6 oz) 89.903 kg (198 lb 3.2 oz)    Examination: Constitutional: NAD  Filed Vitals:   10/24/15 1952 10/25/15 0553 10/25/15 0948 10/25/15 1154  BP: 122/67 134/94 134/67 144/92  Pulse: 103 101 205 105  Temp: 98.6 F (37 C) 98.8 F (37.1 C)  98.1 F (36.7 C)  TempSrc:  Oral  Oral  Resp: 20 20 16 18   Height:      Weight:  89.903 kg (198 lb 3.2 oz)    SpO2: 95%  93% 95% 95%   Eyes: PERRL ENMT: Mucous membranes are moist.  Respiratory: clear to auscultation bilaterally, no wheezing, no crackles.  Cardiovascular: Regular rate and rhythm, no murmurs / rubs / gallops. No extremity edema. 2+ pedal pulses. Tachycardic Abdomen: minimal tenderness to palpation midepigastric area, Bowel sounds positive.   Data Reviewed: I have personally reviewed following labs and imaging studies  CBC:  Recent Labs Lab 10/19/15 0511 10/21/15 0335  WBC 10.5 11.1*  HGB 11.0* 11.2*  HCT 34.4* 35.7*  MCV 90.5 91.5  PLT 364 382   Basic Metabolic Panel:  Recent Labs Lab 10/19/15 0511 10/20/15 0745 10/21/15 0335 10/22/15 1200 10/24/15 0316  NA 139 140 137 138 139  K 4.1 4.2 4.3 4.0 3.3*  CL 108 109 104 101 99*  CO2 21* 19* 21* 24 25  GLUCOSE 200* 179* 199* 268* 222*  BUN <5* <5* <5* <5* <5*  CREATININE 0.55 0.53 0.62 0.65 0.55  CALCIUM 6.5* 8.1* 8.9 8.9 9.0   GFR: Estimated Creatinine Clearance: 94.3 mL/min (by C-G formula based on Cr of 0.55). Liver Function Tests:  Recent Labs Lab 10/21/15 0335 10/22/15 1200  AST 20 17  ALT 14 16  ALKPHOS 77 65  BILITOT 1.1 1.2  PROT 5.8* 5.5*  ALBUMIN 1.8* 1.8*    Recent Labs Lab 10/19/15 0511 10/22/15 1200  LIPASE 67* 41    Recent Labs Lab 10/19/15 0732 10/22/15 1200  AMMONIA 22 17   HbA1C: No results for input(s): HGBA1C in the last 72 hours. CBG:  Recent Labs Lab 10/24/15 1618 10/24/15 2050 10/25/15 0552 10/25/15 1147 10/25/15 1644  GLUCAP 220* 235* 211* 249* 257*   Lipid Profile: No results for input(s): CHOL, HDL, LDLCALC, TRIG, CHOLHDL, LDLDIRECT in the last 72 hours. Thyroid Function Tests: No results for input(s): TSH, T4TOTAL, FREET4, T3FREE, THYROIDAB in the last 72 hours. Anemia Panel: No results for input(s): VITAMINB12, FOLATE, FERRITIN, TIBC, IRON, RETICCTPCT in the last 72 hours. Urine analysis:    Component Value Date/Time   COLORURINE YELLOW 10/19/2015 0423    APPEARANCEUR CLEAR 10/19/2015 0423   LABSPEC 1.023 10/19/2015 0423   PHURINE 5.5 10/19/2015 0423   GLUCOSEU >1000* 10/19/2015 0423   HGBUR LARGE* 10/19/2015 0423   BILIRUBINUR NEGATIVE 10/19/2015 0423   KETONESUR 15* 10/19/2015 0423   PROTEINUR NEGATIVE 10/19/2015 0423   UROBILINOGEN 0.2 11/05/2008 1339   NITRITE NEGATIVE 10/19/2015 0423   LEUKOCYTESUR NEGATIVE 10/19/2015 0423   Sepsis Labs: Invalid input(s): PROCALCITONIN, LACTICIDVEN  Recent Results (from the past 240 hour(s))  MRSA PCR Screening     Status: None   Collection Time: 10/16/15 10:34 AM  Result Value Ref Range Status   MRSA by PCR NEGATIVE NEGATIVE Final    Comment:        The GeneXpert MRSA Assay (FDA approved for NASAL specimens only), is one component of a comprehensive MRSA colonization surveillance program. It is not intended to diagnose MRSA  infection nor to guide or monitor treatment for MRSA infections.     Radiology Studies: No results found.  Scheduled Meds: . diclofenac  1 patch Transdermal BID  . fenofibrate  160 mg Oral Daily  . fluticasone  1 spray Each Nare QHS  . folic acid  1 mg Oral Daily  . insulin aspart  0-9 Units Subcutaneous TID WC  . insulin aspart  2 Units Subcutaneous TID WC  . insulin NPH Human  8 Units Subcutaneous BID AC & HS  . multivitamin with minerals  1 tablet Oral Daily  . pantoprazole  40 mg Oral Q1200  . prochlorperazine  10 mg Intravenous Once  . senna-docusate  2 tablet Oral BID  . sodium chloride flush  3 mL Intravenous Q12H  . thiamine  100 mg Oral Daily   Continuous Infusions:    Time spent: 45 minutes, ~25 minutes bedside  Pamella Pert, MD, PhD [facility] Pager [phone]  If 7PM-7AM, please contact night-coverage [email or web address] Password [facility] 10/25/2015, 4:49 PM

## 2015-10-25 NOTE — Progress Notes (Signed)
Results for [name], Katherine (MRN [id number]) as of 10/25/2015 12:32  Ref. Range 10/24/2015 11:38 10/24/2015 16:18 10/24/2015 20:50 10/25/2015 05:52 10/25/2015 11:47  Glucose-Capillary Latest Ref Range: 65-99 mg/dL 578248 (H) 469220 (H) 629235 (H) 211 (H) 249 (H)  Noted that blood sugars greater than 180 mg/dl. Recommend increasing Lantus to 15-18 units daily (90 kg X 0.2 units/kg) if blood sugars continue to be elevated. May want to consider increasing Novolog correction scale to MODERATE TID. Will continue to monitor blood sugars while in the hospital. Smith MinceKendra Radha Coggins RN BSN CDE

## 2015-10-25 NOTE — Evaluation (Signed)
Physical Therapy Evaluation and Discharge Patient Details Name: [name] MRN: [id number] DOB: 07/16/1965 Today's Date: 10/25/2015   History of Present Illness  Pt is a 50 y/o F who presents w/ acute pancreatitis.  Pt's PMH includes sleep apnea and chronic low back pain (per pt).    Clinical Impression  Pt admitted with above diagnosis. [name] presents w/ chronic low back pain that can be addressed in the OPPT setting.  She is independent w/ all mobility and no instability was noted w/ high level balance activities.  PT is signing off.    Follow Up Recommendations Outpatient PT (to address chronic low back pain)    Equipment Recommendations  None recommended by PT    Recommendations for Other Services       Precautions / Restrictions Restrictions Weight Bearing Restrictions: No      Mobility  Bed Mobility Overal bed mobility: Modified Independent             General bed mobility comments: Cues for log roll technique to protect back and decrease discomfort.    Transfers Overall transfer level: Independent Equipment used: None             General transfer comment: No cues or physical assist needed. No instability noted.  Ambulation/Gait Ambulation/Gait assistance: Independent Ambulation Distance (Feet): 250 Feet Assistive device: None Gait Pattern/deviations: Step-through pattern   Gait velocity interpretation: Below normal speed for age/gender General Gait Details: Slightly decreased gait speed but no outside assist needed.  No instability noted w/ high level balance activities.  Stairs            Wheelchair Mobility    Modified Rankin (Stroke Patients Only)       Balance Overall balance assessment: Independent                               Standardized Balance Assessment Standardized Balance Assessment : Dynamic Gait Index   Dynamic Gait Index Level Surface: Mild Impairment (slower speed) Change in Gait Speed:  Normal Gait with Horizontal Head Turns: Normal Gait with Vertical Head Turns: Normal Gait and Pivot Turn: Normal Step Over Obstacle: Normal Step Around Obstacles: Normal       Pertinent Vitals/Pain Pain Assessment: Faces Faces Pain Scale: Hurts little more Pain Location: low back Pain Descriptors / Indicators: Aching;Grimacing Pain Intervention(s): Limited activity within patient's tolerance;Monitored during session;Repositioned    Home Living Family/patient expects to be discharged to:: Private residence Living Arrangements: Spouse/significant other;Children Available Help at Discharge: Family;Available PRN/intermittently Type of Home: House Home Access: Stairs to enter Entrance Stairs-Rails: Psychiatric nurse of Steps: 6 Home Layout: One level Home Equipment: None      Prior Function Level of Independence: Independent               Hand Dominance        Extremity/Trunk Assessment   Upper Extremity Assessment: Overall WFL for tasks assessed           Lower Extremity Assessment: Overall WFL for tasks assessed      Cervical / Trunk Assessment: Other exceptions  Communication   Communication: No difficulties  Cognition Arousal/Alertness: Awake/alert Behavior During Therapy: WFL for tasks assessed/performed Overall Cognitive Status: Within Functional Limits for tasks assessed                      General Comments      Exercises  Assessment/Plan    PT Assessment All further PT needs can be met in the next venue of care  PT Diagnosis Acute pain   PT Problem List Decreased activity tolerance;Pain  PT Treatment Interventions     PT Goals (Current goals can be found in the Care Plan section) Acute Rehab PT Goals Patient Stated Goal: to go home, get stronger, and lose weight PT Goal Formulation: All assessment and education complete, DC therapy    Frequency     Barriers to discharge        Co-evaluation                End of Session Equipment Utilized During Treatment: Gait belt Activity Tolerance: Patient tolerated treatment well Patient left: in chair;with call bell/phone within reach Nurse Communication: Mobility status         Time: 1430-1459 PT Time Calculation (min) (ACUTE ONLY): 29 min   Charges:   PT Evaluation $PT Eval Low Complexity: 1 Procedure PT Treatments $Gait Training: 8-22 mins   PT G Codes:       Collie Siad PT, DPT  Pager: [phone] Phone: [phone] 10/25/2015, 3:16 PM

## 2015-10-26 LAB — GLUCOSE, CAPILLARY
Glucose-Capillary: 206 mg/dL — ABNORMAL HIGH (ref 65–99)
Glucose-Capillary: 250 mg/dL — ABNORMAL HIGH (ref 65–99)

## 2015-10-26 MED ORDER — IBUPROFEN 400 MG PO TABS: 400.0000 mg | ORAL_TABLET | Freq: Four times a day (QID) | ORAL | Status: AC | PRN

## 2015-10-26 MED ORDER — DICLOFENAC EPOLAMINE 1.3 % TD PTCH: 1.0000 | MEDICATED_PATCH | Freq: Two times a day (BID) | TRANSDERMAL | Status: DC

## 2015-10-26 MED ORDER — INSULIN NPH ISOPHANE & REGULAR (70-30) 100 UNIT/ML ~~LOC~~ SUSP: 11.0000 [IU] | Freq: Two times a day (BID) | SUBCUTANEOUS | Status: AC

## 2015-10-26 MED ORDER — ACETAMINOPHEN 500 MG PO TABS: 500.0000 mg | ORAL_TABLET | Freq: Four times a day (QID) | ORAL | Status: AC | PRN

## 2015-10-26 MED ORDER — "INSULIN SYRINGE-NEEDLE U-100 31G X 5/16"" 0.3 ML MISC": 1.0000 | Freq: Two times a day (BID) | Status: AC

## 2015-10-26 NOTE — Evaluation (Signed)
Occupational Therapy Evaluation Patient Details Name: Katherine IvoryDonna Mcelveen MRN: [id number] DOB: [birth year] Today's Date: 10/26/2015    History of Present Illness Pt is a 50 y/o F who presents w/ acute pancreatitis.  Pt's PMH includes sleep apnea and chronic low back pain (per pt).   Clinical Impression   Patient evaluated by Occupational Therapy with no further acute OT needs identified. All education has been completed and the patient has no further questions. See below for any follow-up Occupational Therapy or equipment needs. OT to sign off. Thank you for referral.   Demonstrates executive cognitive deficits 24 out 30 on the moca     Follow Up Recommendations  No OT follow up    Equipment Recommendations  None recommended by OT    Recommendations for Other Services       Precautions / Restrictions Precautions Precautions: None      Mobility Bed Mobility               General bed mobility comments: in chair on arrival  Transfers Overall transfer level: Independent                    Balance                                            ADL Overall ADL's : Independent;At baseline                                       General ADL Comments: [facility] 24 out 30 on test. Pt shows deficits with drawing a clock, language ( naming from recall), attention and current date 08/27/15 later corrects with cues to 10/25/15. Pt advised to practice driving prior to return to driving son. Pt educated on task to have husband (A) such as medications, bill management and driving upon initial d/c home. Pt advised to have open discussion with spouse about executive cognitive deficits noted and to have spouse help monitor patients return to activities with supervision. 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 Pt put clock face hands in correct location and to the correct time     Vision     Perception     Praxis      Pertinent Vitals/Pain Pain Assessment: No/denies pain Pain Location: low back but managed with medication currently per patient     Hand Dominance Right   Extremity/Trunk Assessment Upper Extremity Assessment Upper Extremity Assessment: Overall WFL for tasks assessed   Lower Extremity Assessment Lower Extremity Assessment: Overall WFL for tasks assessed   Cervical / Trunk Assessment Cervical / Trunk Exceptions: chronic low back pain   Communication Communication Communication: No difficulties   Cognition Arousal/Alertness: Awake/alert Behavior During Therapy: WFL for tasks assessed/performed Overall Cognitive Status: Within Functional Limits for tasks assessed                     General Comments       Exercises       Shoulder Instructions  Home Living Family/patient expects to be discharged to:: Private residence Living Arrangements: Spouse/significant other;Children Available Help at Discharge: Family;Available PRN/intermittently Type of Home: House Home Access: Stairs to enter Entrance Stairs-Number of Steps: 6 Entrance Stairs-Rails: Right;Left Home Layout: One level     Bathroom Shower/Tub: Chief Strategy Officer: Standard     Home Equipment: None   Additional Comments: drives son daily to school      Prior Functioning/Environment Level of Independence: Independent             OT Diagnosis: Cognitive deficits   OT Problem List:     OT Treatment/Interventions:      OT Goals(Current goals can be found in the care plan section) Acute Rehab OT Goals Patient Stated Goal: to go home and talk to my husband  OT Frequency:     Barriers to D/C:            Co-evaluation              End of  Session Nurse Communication: Mobility status;Precautions  Activity Tolerance: Patient tolerated treatment well Patient left: in chair;with call bell/phone within reach   Time: 0814-0840 OT Time Calculation (min): 26 min Charges:  OT General Charges $OT Visit: 1 Procedure OT Evaluation $OT Eval Moderate Complexity: 1 Procedure OT Treatments $Cognitive Skills Development: 8-22 mins G-Codes:    Boone Master B 23-Nov-2015, 9:58 AM  . Mateo Flow   OTR/L Pager: [phone] Office: [phone] .

## 2015-10-26 NOTE — Discharge Summary (Signed)
Physician Discharge Summary  [name] ACZ:[id number] DOB: 1965/10/27 DOA: 10/15/2015  PCP: Hoyle Sauer, MD  Admit date: 10/15/2015 Discharge date: 10/26/2015  Time spent: > 30 minutes  Recommendations for Outpatient Follow-up:  1. Follow up with Dr. Felipa Eth in 1-2 weeks 2. Patient discharged on insulin   Discharge Diagnoses:  Principal Problem:   Acute pancreatitis Active Problems:   Diabetes mellitus, type 2 (HCC)   DKA (diabetic ketoacidoses) (HCC)   Hypertriglyceridemia   Acute encephalopathy  Discharge Condition: stable  Diet recommendation: low fat  Filed Weights   10/24/15 0500 10/25/15 0553 10/26/15 0420  Weight: 90.992 kg (200 lb 9.6 oz) 89.903 kg (198 lb 3.2 oz) 89.359 kg (197 lb)    History of present illness:  See H&P, Labs, Consult and Test reports for all details in brief, patient is a 50 y.o. woman with a history of Type 2 DM (level of control unknown; treated with metformin), GERD, and recurrent pancreatitis (2010, 2012, 2014) who was admitted on 4/14 with acute pancreatitis.  Hospital Course:  Acute recurrent pancreatitis: Likely due to severe hypertriglyceridemia - CT abdomen consistent with acute pancreatitis, lipid panel on admission showed triglycerides over 5000. Patient was admitted to stepdown and received aggressive IVF, insulin and dextrose infusion. Clinically her pancreatitis improved, with repeat lipase in the normal range and repeat triglycerides of 199. Her diet was slowly advanced and by discharge she was able to tolerate a regular diet. Discussed about home insulin needs, healthy dietary changes and ongoing treatment with her lipid lowering medications.  Acute encephalopathy unclear possibly due to narcotics - patient's mental status was good in the initial 1-2 days while hospitalized, however by HOD 3 she became acutely encephalopathic. Husband confirms that she does not drink and they have no alcohol drinks in the house. Hence alcohol  withdrawal less likely. This is likely in the setting of narcotics use and all narcotics medications were discontinued. Eventually patient returned to baseline on 4/24 without any further confusion. While she was encephalopathic, neurology was consulted. Patient underwent an EEG (as below). CT scan of the brain was normal. An MRI was planned however could not be obtained due to extreme claustrophobia, and in the setting of recent severe encephalopathy to obtain one with general anesthesia may be risky. Defer to PCP but may consider outpatient open MRI which patient tolerated in the past.  Hyponatremia, hypocalcemia: Improving - Likely due to profound dehydration, acute pancreatitis and DKA Severe Hypertriglyceridemia - Placed on Tricor and statin Poorly controlled DM with DKA, anion gap of 21 with bicarb of 11 and ketones in urine on admission - Long discussion with patient and CM bedside, multiple questions were answered. It appears that copay for Lantus + Novolog is ~$ 800, whereas 70/30 is $25 a month. 70/30 is suboptimal however due to insurance and cost per patient preference will go with 70/30 of 11 units BID> it appears that patient has breakfast ~ 7 am and dinner ~ 7 pm and recommended that she administers the 70/30 then. Advised for a CBG log and close follow up with Dr. Felipa Eth in 1-2 weeks.   Procedures:  EEG Impression: This awake and drowsy EEG is abnormal due to the presence of: 1. Slowing of the posterior dominant rhythm 2. Moderate diffuse slowing of the waking background 3. Additional focal slowing over the left temporal region  Clinical Correlation of the above findings indicates diffuse cerebral dysfunction that is non-specific in etiology and can be seen with hypoxic/ischemic injury, toxic/metabolic encephalopathies,  or medication effect. Focal slowing over the left temporal region indicates focal cerebral dysfunction in this region suggestive of underlying structural or physiologic  abnormality. The absence of epileptiform discharges does not rule out a clinical diagnosis of epilepsy. Clinical correlation is advised.   Consultations:  Neurology   Discharge Exam: Filed Vitals:   10/25/15 1154 10/25/15 2100 10/26/15 0420 10/26/15 1158  BP: 144/92 131/69 132/79 130/77  Pulse: 105 97 94 95  Temp: 98.1 F (36.7 C) 98.5 F (36.9 C) 98.6 F (37 C) 98.2 F (36.8 C)  TempSrc: Oral Oral Oral Oral  Resp: 18 18 20 20   Height:      Weight:   89.359 kg (197 lb)   SpO2: 95% 97% 95% 95%   General: NAD Cardiovascular: RRR Respiratory: CTA biL  Discharge Instructions Activity:  As tolerated   Get Medicines reviewed and adjusted: Please take all your medications with you for your next visit with your Primary MD  Please request your Primary MD to go over all hospital tests and procedure/radiological results at the follow up, please ask your Primary MD to get all Hospital records sent to his/her office.  If you experience worsening of your admission symptoms, develop shortness of breath, life threatening emergency, suicidal or homicidal thoughts you must seek medical attention immediately by calling 911 or calling your MD immediately if symptoms less severe.  You must read complete instructions/literature along with all the possible adverse reactions/side effects for all the Medicines you take and that have been prescribed to you. Take any new Medicines after you have completely understood and accpet all the possible adverse reactions/side effects.   Do not drive when taking Pain medications.   Do not take more than prescribed Pain, Sleep and Anxiety Medications  Special Instructions: If you have smoked or chewed Tobacco in the last 2 yrs please stop smoking, stop any regular Alcohol and or any Recreational drug use.  Wear Seat belts while driving.  Please note  You were cared for by a hospitalist during your hospital stay. Once you are discharged, your primary  care physician will handle any further medical issues. Please note that NO REFILLS for any discharge medications will be authorized once you are discharged, as it is imperative that you return to your primary care physician (or establish a relationship with a primary care physician if you do not have one) for your aftercare needs so that they can reassess your need for medications and monitor your lab values.    Medication List    STOP taking these medications        azithromycin 250 MG tablet  Commonly known as:  ZITHROMAX     oxyCODONE-acetaminophen 5-325 MG tablet  Commonly known as:  PERCOCET      TAKE these medications        acetaminophen 500 MG tablet  Commonly known as:  TYLENOL  Take 1 tablet (500 mg total) by mouth every 6 (six) hours as needed.     ADVAIR DISKUS 250-50 MCG/DOSE Aepb  Generic drug:  Fluticasone-Salmeterol  Inhale 1 spray into the lungs 2 (two) times daily as needed (for SOB).     calcium citrate-vitamin D 315-200 MG-UNIT tablet  Commonly known as:  CITRACAL+D  Take 1 tablet by mouth 2 (two) times daily.     cholecalciferol 1000 units tablet  Commonly known as:  VITAMIN D  Take 1,000 Units by mouth daily.     diclofenac 1.3 % Ptch  Commonly known as:  FLECTOR  Place 1 patch onto the skin 2 (two) times daily.     fenofibrate 160 MG tablet  Take 160 mg by mouth every morning.     Fish Oil 1200 MG Caps  Take 1,200 mg by mouth 2 (two) times daily.     fluticasone 50 MCG/ACT nasal spray  Commonly known as:  FLONASE  Place 2 sprays into the nose at bedtime.     ibuprofen 400 MG tablet  Commonly known as:  ADVIL,MOTRIN  Take 1 tablet (400 mg total) by mouth every 6 (six) hours as needed for moderate pain.     insulin NPH-regular Human (70-30) 100 UNIT/ML injection  Commonly known as:  NOVOLIN 70/30  Inject 11 Units into the skin 2 (two) times daily with a meal.     Insulin Syringe-Needle U-100 31G X 5/16" 0.3 ML Misc  Commonly known as:  B-D  INS SYR ULTRAFINE .3CC/31G  1 Syringe by Does not apply route 2 (two) times daily.     loratadine 10 MG tablet  Commonly known as:  CLARITIN  Take 10 mg by mouth every morning.     Melatonin 1 MG Caps  Take 1 mg by mouth every evening.     metFORMIN 500 MG 24 hr tablet  Commonly known as:  GLUCOPHAGE-XR  Take 500-1,000 mg by mouth 2 (two) times daily. Take 1 tablet with breakfast and lunch and 2 tablets with dinner     MULTI-ENZYME Tabs  Take 1 tablet by mouth 3 (three) times daily.     multivitamin with minerals Tabs tablet  Take 2 tablets by mouth 2 (two) times daily.     norethindrone-ethinyl estradiol 1-20 MG-MCG tablet  Commonly known as:  MICROGESTIN,JUNEL,LOESTRIN  Take 1 tablet by mouth daily.     ondansetron 8 MG disintegrating tablet  Commonly known as:  ZOFRAN ODT   ODT q8 hours prn nausea     OVER THE COUNTER MEDICATION  Take 1 tablet by mouth 2 (two) times daily. amla-c [place] groseberry     OVER THE COUNTER MEDICATION  Take 1 capsule by mouth every morning. relshi mushroom     OVER THE COUNTER MEDICATION  Take 10 mLs by mouth 2 (two) times daily. L-glutamne     PROBIOTIC PO  Take 1 tablet by mouth 2 (two) times daily.     Quercetin 250 MG Tabs  Take 500 mg by mouth 2 (two) times daily.     simvastatin 40 MG tablet  Commonly known as:  ZOCOR  Take 40 mg by mouth at bedtime.     Turmeric 500 MG Caps  Take 1 tablet by mouth every morning.     VALERIAN ROOT PO  Take 1,020 mg by mouth every evening.           Follow-up Information    Follow up with Hoyle Sauer, MD. Schedule an appointment as soon as possible for a visit in 1 week.   Specialty:  Internal Medicine   Contact information:   [address] [place] [address] [phone]       The results of significant diagnostics from this hospitalization (including imaging, microbiology, ancillary and laboratory) are listed below for reference.    Significant Diagnostic  Studies: Ct Head Wo Contrast  10/21/2015  CLINICAL DATA:  Acute encephalopathy, type II diabetes mellitus EXAM: CT HEAD WITHOUT CONTRAST TECHNIQUE: Contiguous axial images were obtained from the base of the skull through the vertex without intravenous contrast. COMPARISON:  None FINDINGS:  Normal ventricular morphology. No midline shift or mass effect. Normal appearance of brain parenchyma. No intracranial hemorrhage, mass lesion or evidence acute infarction. No extra-axial fluid collections. Tiny amounts of fluid at the maxillary sinus apices bilaterally. Bones unremarkable. IMPRESSION: No acute intracranial abnormalities. Electronically Signed   By: Ulyses Southward M.D.   On: 10/21/2015 22:17   Ct Abdomen Pelvis W Contrast  10/16/2015  CLINICAL DATA:  Left lower quadrant abdominal pain EXAM: CT ABDOMEN AND PELVIS WITH CONTRAST TECHNIQUE: Multidetector CT imaging of the abdomen and pelvis was performed using the standard protocol following bolus administration of intravenous contrast. CONTRAST:  1 ISOVUE-300 IOPAMIDOL (ISOVUE-300) INJECTION 61% COMPARISON:  Ultrasound 10/05/2010 CT 04/01/10 FINDINGS: Lower chest: Mild atelectatic appearing patchy lung base opacities bilaterally. No effusions. Hepatobiliary: There are normal appearances of the liver, gallbladder and bile ducts. Pancreas: There is intense inflammatory stranding around the pancreas. There is small-moderate volume peripancreatic fluid. The pancreatic parenchyma enhances uniformly. No pancreatic duct dilatation. No abscess. Spleen: Normal Adrenals/Urinary Tract: The adrenals and kidneys are normal in appearance. There is no urinary calculus evident. There is no hydronephrosis or ureteral dilatation. Collecting systems and ureters appear unremarkable. Stomach/Bowel: There are normal appearances of the stomach, small bowel and colon. The appendix is normal. Vascular/Lymphatic: The abdominal aorta is normal in caliber. There is no atherosclerotic  calcification. There is no adenopathy in the abdomen or pelvis. Reproductive: Normal uterus and ovaries Other: Small fat containing umbilical hernia Musculoskeletal: No significant skeletal lesions IMPRESSION: Acute pancreatitis with intense inflammation and small-moderate peripancreatic fluid. No evidence of pancreatic necrosis. No abscess. Electronically Signed   By: Ellery Plunk M.D.   On: 10/16/2015 04:56   Dg Abd Portable 2v  10/20/2015  CLINICAL DATA:  Acute pancreatitis. EXAM: PORTABLE ABDOMEN - 2 VIEW COMPARISON:  CT scan of October 16, 2015. FINDINGS: The bowel gas pattern is normal. There is no evidence of free air. Phleboliths are noted in the pelvis. IMPRESSION: No definite evidence of bowel obstruction or ileus. Electronically Signed   By: Lupita Raider, M.D.   On: 10/20/2015 07:54    Microbiology: No results found for this or any previous visit (from the past 240 hour(s)).   Labs: Basic Metabolic Panel:  Recent Labs Lab 10/20/15 0745 10/21/15 0335 10/22/15 1200 10/24/15 0316  NA 140 137 138 139  K 4.2 4.3 4.0 3.3*  CL 109 104 101 99*  CO2 19* 21* 24 25  GLUCOSE 179* 199* 268* 222*  BUN <5* <5* <5* <5*  CREATININE 0.53 0.62 0.65 0.55  CALCIUM 8.1* 8.9 8.9 9.0   Liver Function Tests:  Recent Labs Lab 10/21/15 0335 10/22/15 1200  AST 20 17  ALT 14 16  ALKPHOS 77 65  BILITOT 1.1 1.2  PROT 5.8* 5.5*  ALBUMIN 1.8* 1.8*    Recent Labs Lab 10/22/15 1200  LIPASE 41    Recent Labs Lab 10/22/15 1200  AMMONIA 17   CBC:  Recent Labs Lab 10/21/15 0335  WBC 11.1*  HGB 11.2*  HCT 35.7*  MCV 91.5  PLT 382   CBG:  Recent Labs Lab 10/25/15 1147 10/25/15 1644 10/25/15 2054 10/26/15 0610 10/26/15 1157  GLUCAP 249* 257* 261* 206* 250*    Signed:  Pamella Pert  [facility] 10/26/2015, 1:43 PM

## 2015-10-26 NOTE — Discharge Instructions (Signed)
Follow with Tivis Ringer, MD in 5-7 days  Please get a complete blood count and chemistry panel checked by your Primary MD at your next visit, and again as instructed by your Primary MD. Please get your medications reviewed and adjusted by your Primary MD.  Please request your Primary MD to go over all Hospital Tests and Procedure/Radiological results at the follow up, please get all Hospital records sent to your Prim MD by signing hospital release before you go home.  If you had Pneumonia of Lung problems at the Hospital: Please get a 2 view Chest X ray done in 6-8 weeks after hospital discharge or sooner if instructed by your Primary MD.  If you have Congestive Heart Failure: Please call your Cardiologist or Primary MD anytime you have any of the following symptoms:  1) 3 pound weight gain in 24 hours or 5 pounds in 1 week  2) shortness of breath, with or without a dry hacking cough  3) swelling in the hands, feet or stomach  4) if you have to sleep on extra pillows at night in order to breathe  Follow cardiac low salt diet and 1.5 lit/day fluid restriction.  If you have diabetes Accuchecks 4 times/day, Once in AM empty stomach and then before each meal. Log in all results and show them to your primary doctor at your next visit. If any glucose reading is under 80 or above 300 call your primary MD immediately.  If you have Seizure/Convulsions/Epilepsy: Please do not drive, operate heavy machinery, participate in activities at heights or participate in high speed sports until you have seen by Primary MD or a Neurologist and advised to do so again.  If you had Gastrointestinal Bleeding: Please ask your Primary MD to check a complete blood count within one week of discharge or at your next visit. Your endoscopic/colonoscopic biopsies that are pending at the time of discharge, will also need to followed by your Primary MD.  Get Medicines reviewed and adjusted. Please take all your  medications with you for your next visit with your Primary MD  Please request your Primary MD to go over all hospital tests and procedure/radiological results at the follow up, please ask your Primary MD to get all Hospital records sent to his/her office.  If you experience worsening of your admission symptoms, develop shortness of breath, life threatening emergency, suicidal or homicidal thoughts you must seek medical attention immediately by calling 911 or calling your MD immediately  if symptoms less severe.  You must read complete instructions/literature along with all the possible adverse reactions/side effects for all the Medicines you take and that have been prescribed to you. Take any new Medicines after you have completely understood and accpet all the possible adverse reactions/side effects.   Do not drive or operate heavy machinery when taking Pain medications.   Do not take more than prescribed Pain, Sleep and Anxiety Medications  Special Instructions: If you have smoked or chewed Tobacco  in the last 2 yrs please stop smoking, stop any regular Alcohol  and or any Recreational drug use.  Wear Seat belts while driving.  Please note You were cared for by a hospitalist during your hospital stay. If you have any questions about your discharge medications or the care you received while you were in the hospital after you are discharged, you can call the unit and asked to speak with the hospitalist on call if the hospitalist that took care of you is not available. Once  you are discharged, your primary care physician will handle any further medical issues. Please note that NO REFILLS for any discharge medications will be authorized once you are discharged, as it is imperative that you return to your primary care physician (or establish a relationship with a primary care physician if you do not have one) for your aftercare needs so that they can reassess your need for medications and monitor your  lab values.  You can reach the hospitalist office at phone [phone] or fax [phone]   If you do not have a primary care physician, you can call [phone] for a physician referral.  Activity: As tolerated with Full fall precautions use walker/cane & assistance as needed  Diet: diabetic  Disposition Home

## 2015-10-26 NOTE — Progress Notes (Signed)
OT NOTE Pt demonstrates cognitive deficits at this time with a score of 24 out 30 on the MOCA. Pt educated on deficits revealed on the test and advised not to immediately return to driving without practice. 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 Formal evaluation to follow this note   [name], [name]   OTR/L Pager: [phone] Office: [phone] .

## 2015-10-26 NOTE — Progress Notes (Signed)
1100 pt observed for insulin self adninistratin. Performed eit ease

## 2015-11-01 MED ORDER — DICLOFENAC SODIUM 1 % TD GEL: 2.0000 g | Freq: Four times a day (QID) | TRANSDERMAL | Status: AC

## 2015-12-02 ENCOUNTER — Encounter: Payer: Self-pay | Admitting: Dietician

## 2015-12-02 ENCOUNTER — Encounter: Payer: BLUE CROSS/BLUE SHIELD | Attending: Internal Medicine | Admitting: Dietician

## 2015-12-02 VITALS — Ht 65.0 in | Wt 185.0 lb

## 2015-12-02 DIAGNOSIS — Z713 Dietary counseling and surveillance: Secondary | ICD-10-CM | POA: Insufficient documentation

## 2015-12-02 DIAGNOSIS — E119 Type 2 diabetes mellitus without complications: Secondary | ICD-10-CM

## 2015-12-02 DIAGNOSIS — Z119 Encounter for screening for infectious and parasitic diseases, unspecified: Secondary | ICD-10-CM | POA: Diagnosis not present

## 2015-12-02 DIAGNOSIS — E669 Obesity, unspecified: Secondary | ICD-10-CM

## 2015-12-02 NOTE — Progress Notes (Signed)
Medical Nutrition Therapy:  Appt start time: 1300 end time:  1430.   Assessment:  Primary concerns today: Patient is here alone.  She would like to learn how much fat that she should be eating due to hx of pancreatitis, the amount of carbohydrate for weight loss, and how much meat to eat.  She has been keeping a food journal and averaging her intake.  She has been trying to keep fat to 20 grams per day, 8 grams saturated fat, 75 grams carbohydrate per day.   Consult for type 2 diabetes (since 2010), pancreatic insufficiency and hyperlipidemia.  She checks her blood sugar 2-3 times per day.  Overall average 115 for the past 2 weeks.  Her last HgbA1C was 10.6% 10/18/15.  She has been hospitalized with pancreatitis 4 times.  Weight hx: 203 lbs 10/14/15 decreased to 179 lbs this am on home scale.  185 lbs on office scale today.  Patient lives with her husband and 77 yo son.  "My husband is a horrible eater."  She states that he is supportive when she cooks healthy but he does not cook healthy.  Patient does most of the shopping and cooked.  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  Preferred Learning Style:   No preference indicated   Learning Readiness:   Ready  Change in progress  MEDICATIONS:  See list to include pancreatic enzymes, novolin 70/30 13 units each am and 12 units each pm and Metformin   DIETARY INTAKE: 24-hr recall:  B ( AM): [place] or 3 eggs with 0-1/2 egg yolk and canadian bacon, very small amount of fruit (1/2 cup berries, apple wedge, or 3 orange slices) and whole grain toast, boiled egg occasionally with cheese or cottage cheese Snk ( AM): cottage cheese or FF vanilla yogurt with protein powder, or small smoothie and protein powder or cherries or protein bar  L ( PM): romaine, spinach, [place], FF Feta cheese, grain crackers, measured lite dressing Snk (3PM): similar to am snack OR almonds (chewed very well) D (6:30PM): pasta or spiralized zucchini with [place] spaghetti sauce Snk (  PM): rice cakes with 2 tsp almond butter Beverages: water, zevia soda or honest fizz  Usual physical activity: 1-1/2 miles daily and plans on starting a [employer] cardio video and rowing machine that she has at her house.  Estimated energy needs: 1600 calories 180 g carbohydrates 120 g protein 44 g fat  Progress Towards Goal(s):  In progress.   Nutritional Diagnosis:  NB-1.1 Food and nutrition-related knowledge deficit As related to balance of carbohydrates, protein, and fat.  As evidenced by diet hx and patient report.    Intervention:  Nutrition counseling and diabetes education initiated. Discussed Carb Counting by food group as method of portion control, reading food labels, and benefits of increased activity. Also discussed basic physiology of Diabetes, target BG ranges pre and post meals, and A1c. Discussed diet guidelines in relationship to her pancreatitis and hyperlipidemia as well.   Plan:  Aim for 2-3 Carb Choices per meal (30-45 grams) +/- 1 either way  Aim for 0-1 Carbs per snack if hungry  Include protein in moderation with your meals and snacks Consider reading food labels for Total Carbohydrate and Fat Grams of foods Consider  increasing your activity level by walking, rowing, or exercise video for 30-60 minutes daily as tolerated Consider checking BG at alternate times per day as directed by MD  Consider taking medication as directed by MD  1600 calories per day  45% carbohydrate = 180 grams  30 % protein = 120 grams  25% fat = 44 grams  Teaching Method Utilized:  Visual Auditory Hands on  Handouts given during visit include:  Meal plan card  Label reading  Support group flyer  A1C sheet  Snack list  Pancreatitis Nutrition therapy from [facility]  Pancreatitis Label reading tips from AND  Barriers to learning/adherence to lifestyle change: none  Demonstrated degree of understanding via:  Teach Back   Monitoring/Evaluation:  Dietary intake,  exercise, label reading, and body weight prn.

## 2015-12-02 NOTE — Patient Instructions (Signed)
Plan:  Aim for 2-3 Carb Choices per meal (30-45 grams) +/- 1 either way  Aim for 0-1 Carbs per snack if hungry  Include protein in moderation with your meals and snacks Consider reading food labels for Total Carbohydrate and Fat Grams of foods Consider  increasing your activity level by walking, rowing, or exercise video for 30-60 minutes daily as tolerated Consider checking BG at alternate times per day as directed by MD  Consider taking medication as directed by MD  1600 calories per day  45% carbohydrate = 180 grams  30 % protein = 120 grams  25% fat = 44 grams

## 2015-12-22 ENCOUNTER — Encounter: Payer: Self-pay | Admitting: Internal Medicine

## 2016-09-06 ENCOUNTER — Other Ambulatory Visit: Payer: Self-pay

## 2017-03-08 ENCOUNTER — Other Ambulatory Visit: Payer: Self-pay | Admitting: Radiology

## 2018-03-06 IMAGING — CR DG ABD PORTABLE 2V
3 series · 3 of 3 positions shown · non-contrast
Comparison: CT scan of October 16, 2015.

CLINICAL DATA: Acute pancreatitis.

EXAM:
PORTABLE ABDOMEN - 2 VIEW

[AP (1 of 2)]
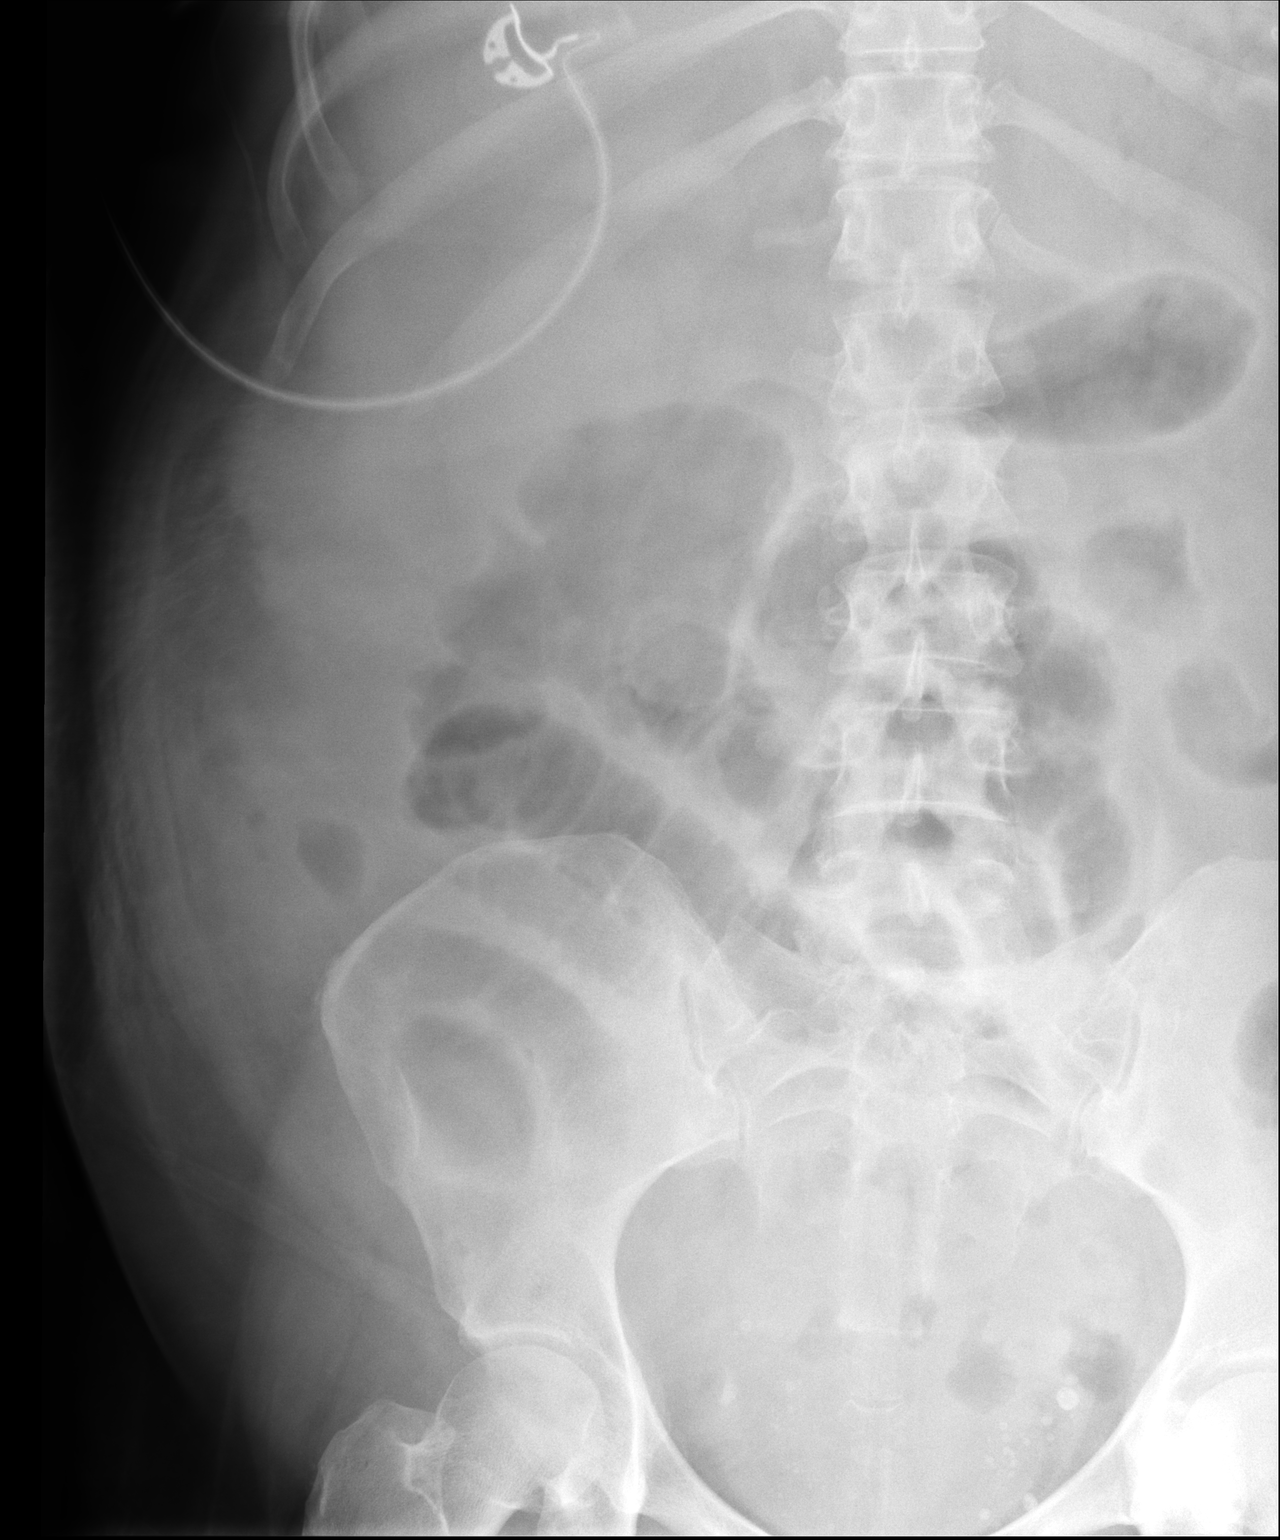

[ap lld]
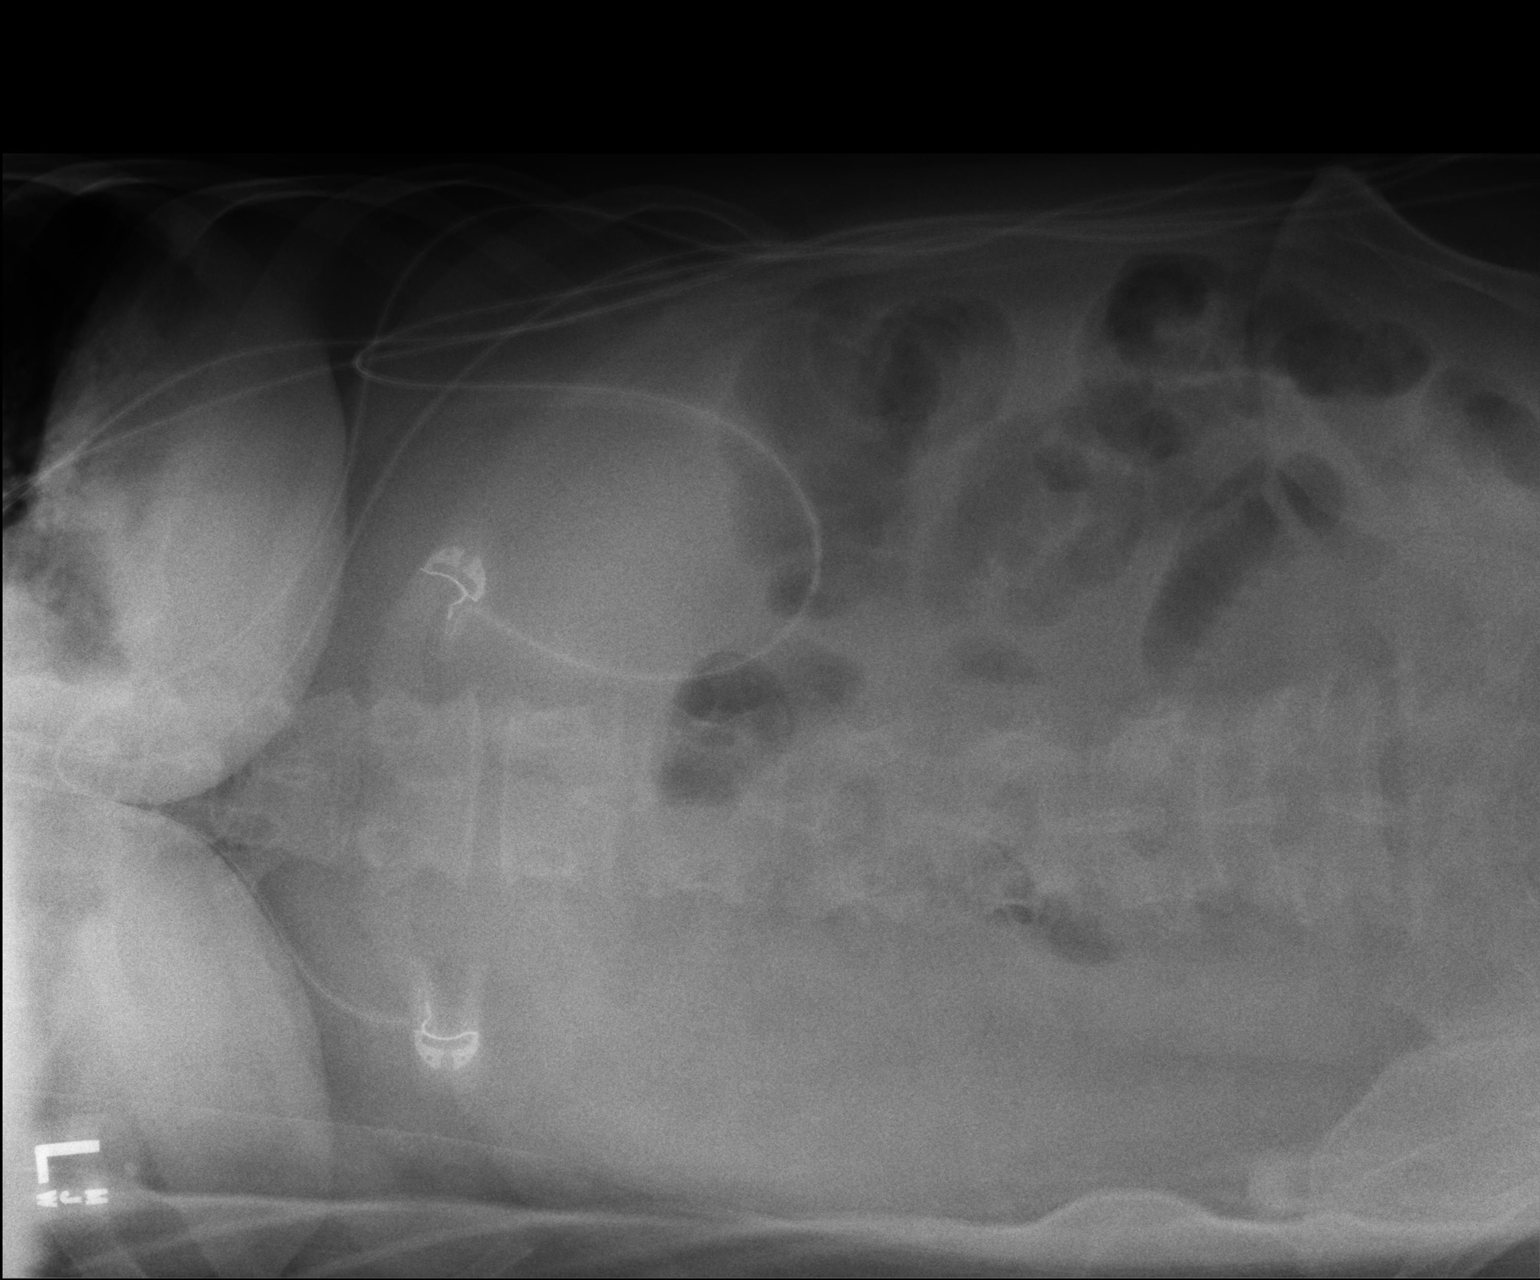

[AP (2 of 2)]
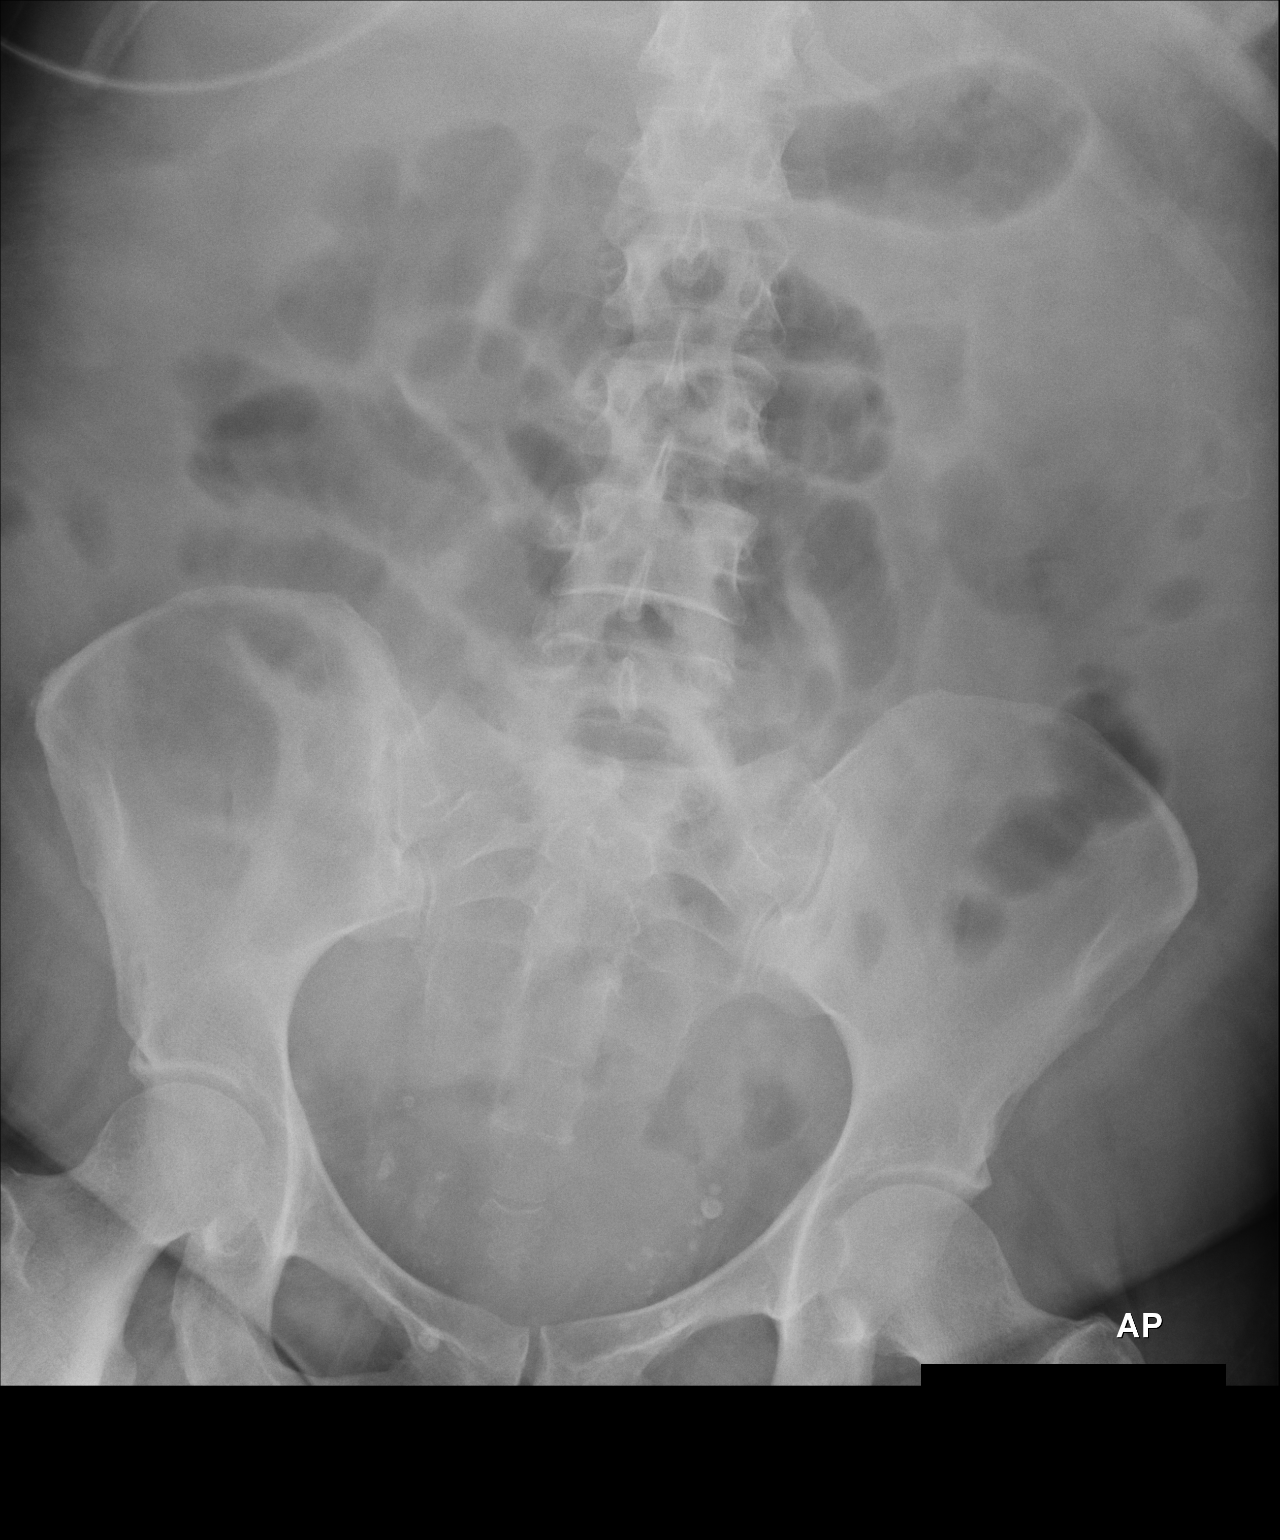

[3 of 3 positions shown; findings below may reference images not displayed]

FINDINGS: The bowel gas pattern is normal. There is no evidence of free air.
Phleboliths are noted in the pelvis.
IMPRESSION: No definite evidence of bowel obstruction or ileus.

## 2018-03-07 IMAGING — CT CT HEAD W/O CM
2 series · 15 of 30 positions shown, 17 images · non-contrast
Comparison: None

CLINICAL DATA: Acute encephalopathy, type II diabetes mellitus

EXAM:
CT HEAD WITHOUT CONTRAST
TECHNIQUE: Contiguous axial images were obtained from the base of the skull
through the vertex without intravenous contrast.

[Series 2: head without · axial · non-contrast · 0.42mm/px · z∈[+227,+347]mm · 7 of 33 slices shown, 9 images]
[im 5/33  brain]
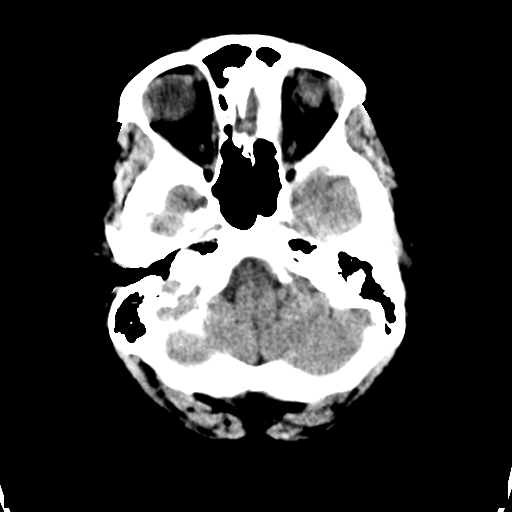
[im 5/33  bone]
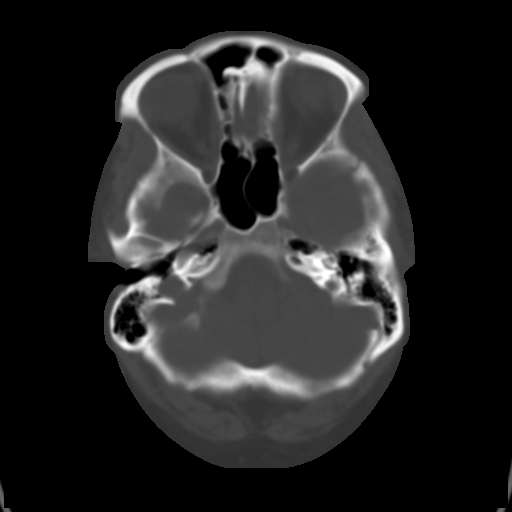
[im 9/33  brain]
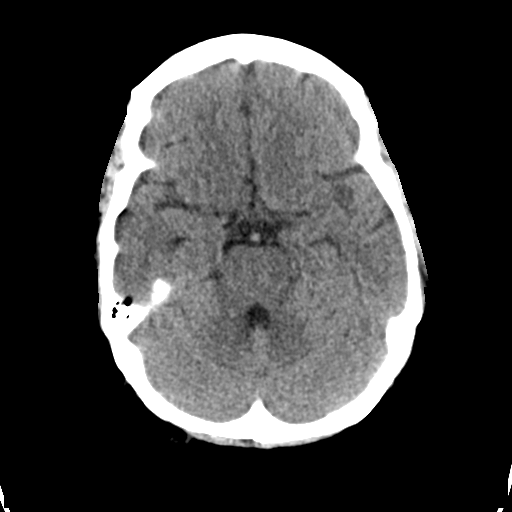
[im 13/33  brain]
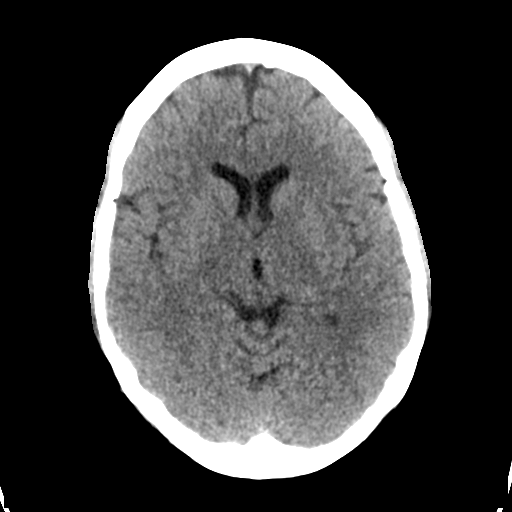
[im 17/33  brain]
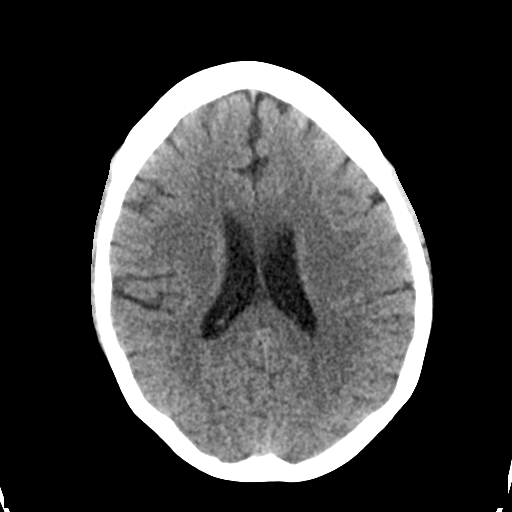
[im 21/33  brain]
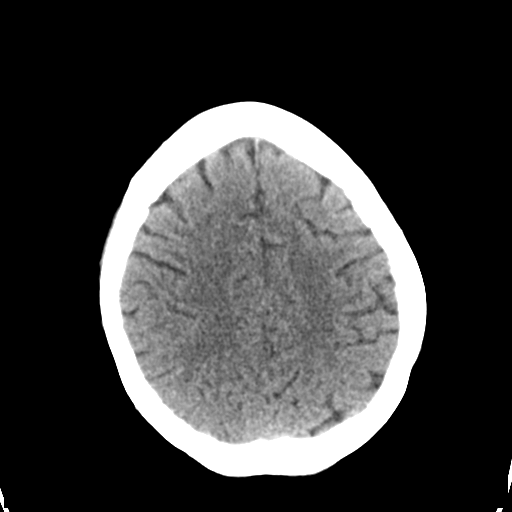
[im 21/33  bone]
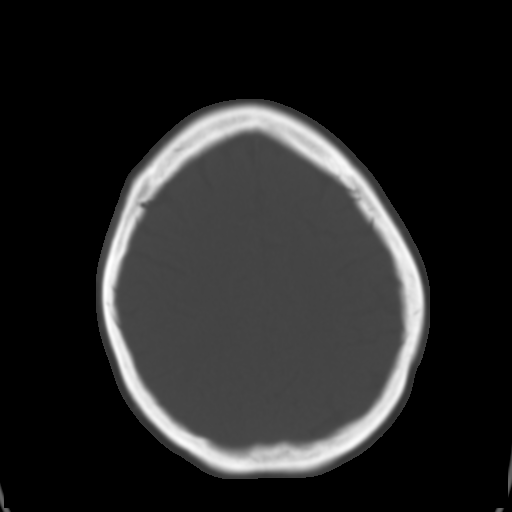
[im 25/33  brain]
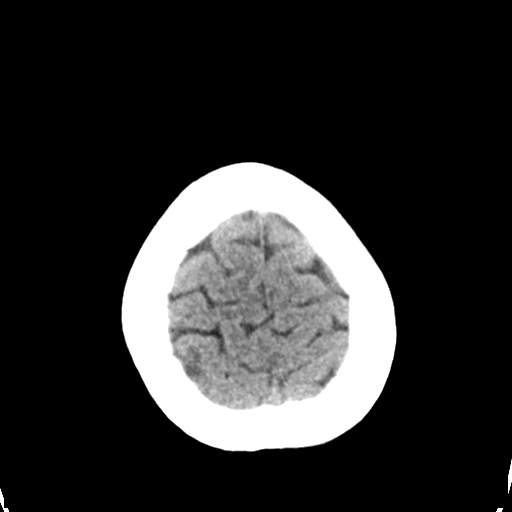
[im 29/33  brain]
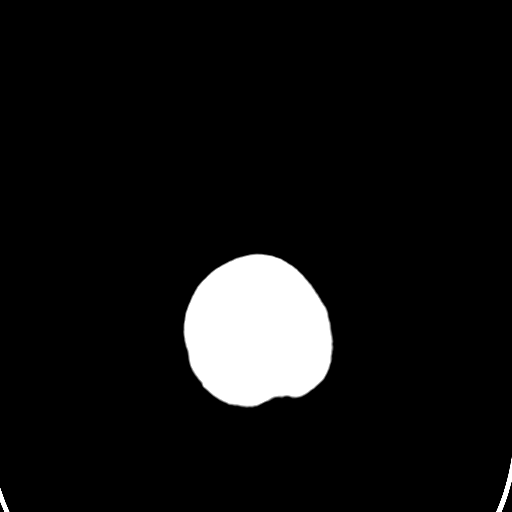

[Series 3: head bone · axial · 0.42mm/px · z∈[+223,+351]mm · 8 of 81 slices shown]
[im 9/81  bone]
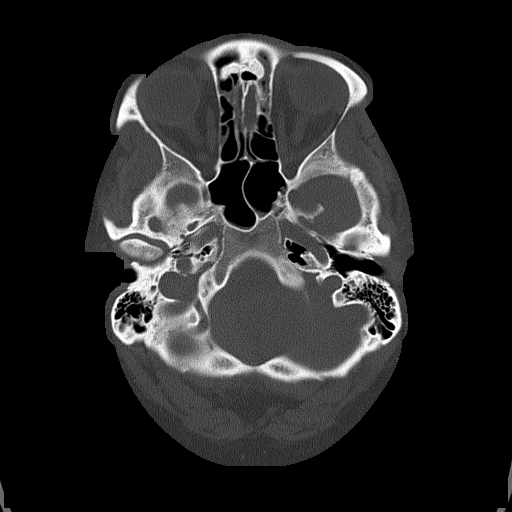
[im 17/81  bone]
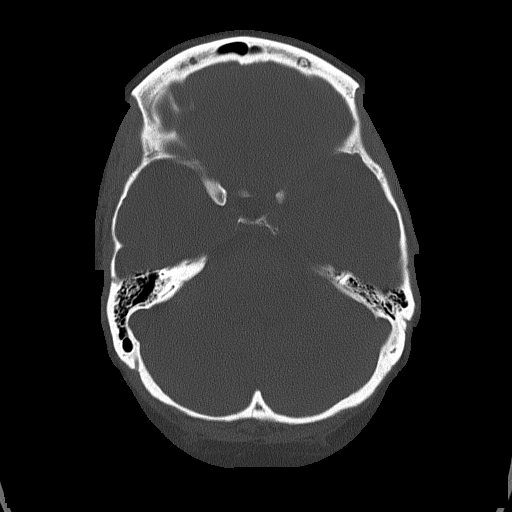
[im 25/81  bone]
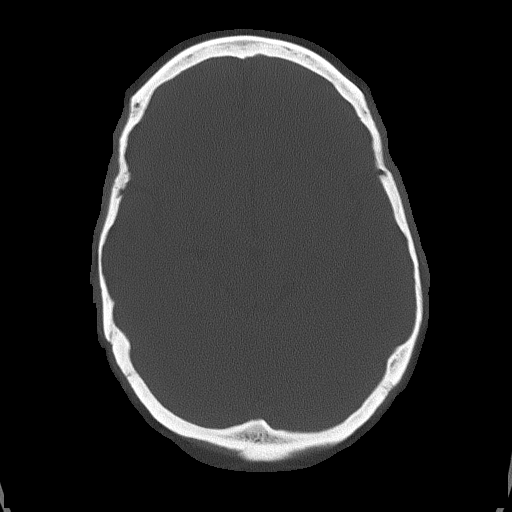
[im 37/81  bone]
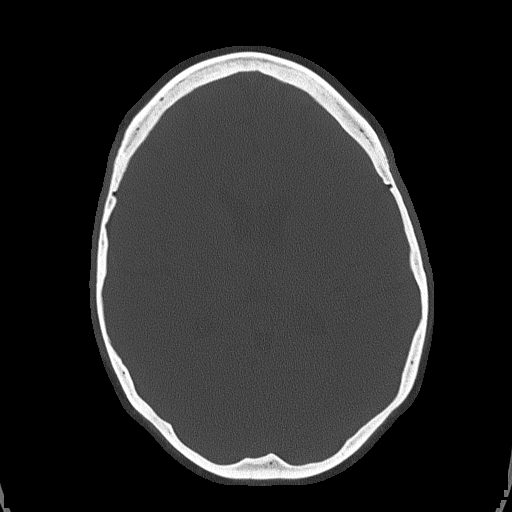
[im 45/81  bone]
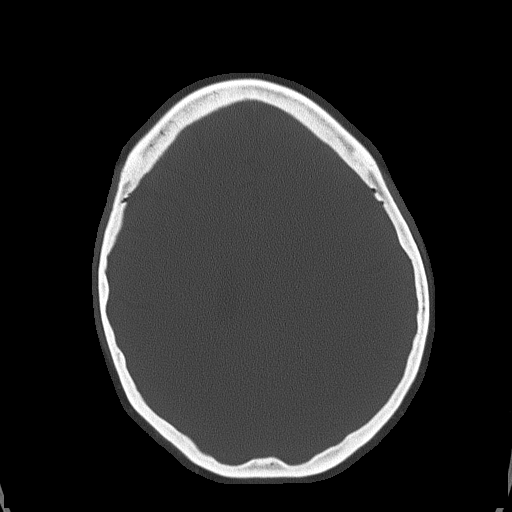
[im 57/81  bone]
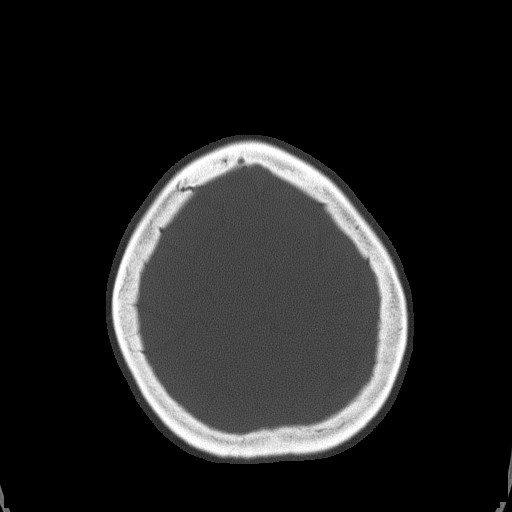
[im 65/81  bone]
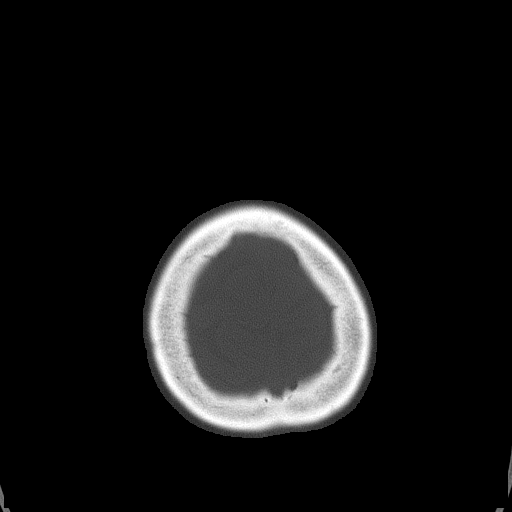
[im 73/81  bone]
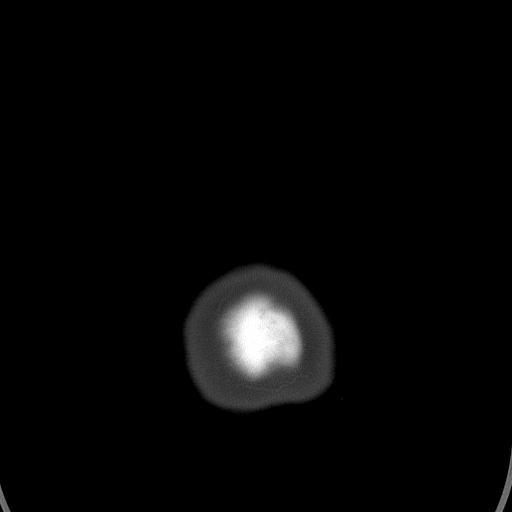

[15 of 30 positions shown; findings below may reference images not displayed]

FINDINGS: Normal ventricular morphology.

No midline shift or mass effect.

Normal appearance of brain parenchyma.

No intracranial hemorrhage, mass lesion or evidence acute
infarction.

No extra-axial fluid collections.

Tiny amounts of fluid at the maxillary sinus apices bilaterally.

Bones unremarkable.
IMPRESSION: No acute intracranial abnormalities.
